# Patient Record
Sex: Female | Born: 1945 | Race: White | Hispanic: No | Marital: Married | Smoking: Never smoker
Health system: Southern US, Community
[De-identification: ages and names within clinical notes are randomized; demographics above are authoritative.]

## PROBLEM LIST (undated history)

## (undated) DIAGNOSIS — I10 Essential (primary) hypertension: Secondary | ICD-10-CM

## (undated) DIAGNOSIS — R32 Unspecified urinary incontinence: Secondary | ICD-10-CM

## (undated) HISTORY — DX: Essential (primary) hypertension: I10

## (undated) HISTORY — DX: Unspecified urinary incontinence: R32

## (undated) HISTORY — PX: CERVICAL DISC SURGERY: SHX588

## (undated) HISTORY — PX: DILATION AND CURETTAGE OF UTERUS: SHX78

## (undated) HISTORY — PX: OTHER SURGICAL HISTORY: SHX169

## (undated) HISTORY — PX: TUBAL LIGATION: SHX77

---

## 1997-07-29 ENCOUNTER — Ambulatory Visit (HOSPITAL_COMMUNITY): Admission: RE | Admit: 1997-07-29 | Discharge: 1997-07-29 | Payer: Self-pay | Admitting: Gastroenterology

## 1998-01-14 ENCOUNTER — Other Ambulatory Visit: Admission: RE | Admit: 1998-01-14 | Discharge: 1998-01-14 | Payer: Self-pay | Admitting: Gynecology

## 1999-01-02 ENCOUNTER — Other Ambulatory Visit: Admission: RE | Admit: 1999-01-02 | Discharge: 1999-01-02 | Payer: Self-pay | Admitting: Gynecology

## 1999-04-09 ENCOUNTER — Encounter: Payer: Self-pay | Admitting: Neurosurgery

## 1999-04-09 ENCOUNTER — Ambulatory Visit (HOSPITAL_COMMUNITY): Admission: RE | Admit: 1999-04-09 | Discharge: 1999-04-09 | Payer: Self-pay | Admitting: Neurosurgery

## 2001-03-22 ENCOUNTER — Encounter: Payer: Self-pay | Admitting: Obstetrics and Gynecology

## 2001-03-22 ENCOUNTER — Ambulatory Visit (HOSPITAL_COMMUNITY): Admission: RE | Admit: 2001-03-22 | Discharge: 2001-03-22 | Payer: Self-pay | Admitting: Obstetrics and Gynecology

## 2001-08-01 ENCOUNTER — Ambulatory Visit (HOSPITAL_COMMUNITY): Admission: RE | Admit: 2001-08-01 | Discharge: 2001-08-01 | Payer: Self-pay | Admitting: Obstetrics and Gynecology

## 2002-06-27 ENCOUNTER — Other Ambulatory Visit: Admission: RE | Admit: 2002-06-27 | Discharge: 2002-06-27 | Payer: Self-pay | Admitting: Obstetrics and Gynecology

## 2003-09-13 ENCOUNTER — Other Ambulatory Visit: Admission: RE | Admit: 2003-09-13 | Discharge: 2003-09-13 | Payer: Self-pay | Admitting: Obstetrics and Gynecology

## 2004-10-12 ENCOUNTER — Other Ambulatory Visit: Admission: RE | Admit: 2004-10-12 | Discharge: 2004-10-12 | Payer: Self-pay | Admitting: Obstetrics and Gynecology

## 2009-12-23 ENCOUNTER — Observation Stay (HOSPITAL_COMMUNITY)
Admission: EM | Admit: 2009-12-23 | Discharge: 2009-12-24 | Payer: Self-pay | Source: Home / Self Care | Admitting: Emergency Medicine

## 2010-03-06 ENCOUNTER — Other Ambulatory Visit: Payer: Self-pay | Admitting: Dermatology

## 2010-03-20 ENCOUNTER — Other Ambulatory Visit: Payer: Self-pay | Admitting: Obstetrics and Gynecology

## 2010-04-02 ENCOUNTER — Other Ambulatory Visit (HOSPITAL_COMMUNITY): Payer: Self-pay

## 2010-04-02 ENCOUNTER — Encounter (HOSPITAL_COMMUNITY)
Admission: RE | Admit: 2010-04-02 | Discharge: 2010-04-02 | Disposition: A | Discharge status: Home / Self Care | Payer: Managed Care, Other (non HMO) | Source: Ambulatory Visit | Attending: Obstetrics and Gynecology | Admitting: Obstetrics and Gynecology

## 2010-04-02 DIAGNOSIS — Z01812 Encounter for preprocedural laboratory examination: Secondary | ICD-10-CM | POA: Insufficient documentation

## 2010-04-02 HISTORY — PX: OTHER SURGICAL HISTORY: SHX169

## 2010-04-02 HISTORY — PX: ABDOMINAL HYSTERECTOMY: SHX81

## 2010-04-02 LAB — BASIC METABOLIC PANEL
BUN: 14 mg/dL (ref 6–23)
CO2: 27 mEq/L (ref 19–32)
Chloride: 105 mEq/L (ref 96–112)
Creatinine, Ser: 0.73 mg/dL (ref 0.4–1.2)
Potassium: 4.2 mEq/L (ref 3.5–5.1)

## 2010-04-02 LAB — CBC
HCT: 38.9 % (ref 36.0–46.0)
Hemoglobin: 12.7 g/dL (ref 12.0–15.0)
MCH: 28.4 pg (ref 26.0–34.0)
MCV: 87 fL (ref 78.0–100.0)
RBC: 4.47 MIL/uL (ref 3.87–5.11)
WBC: 4.2 10*3/uL (ref 4.0–10.5)

## 2010-04-07 ENCOUNTER — Inpatient Hospital Stay (HOSPITAL_COMMUNITY)
Admission: RE | Admit: 2010-04-07 | Discharge: 2010-04-10 | DRG: 742 | Disposition: A | Discharge status: Home / Self Care | Payer: Managed Care, Other (non HMO) | Source: Ambulatory Visit | Attending: Obstetrics and Gynecology | Admitting: Obstetrics and Gynecology

## 2010-04-07 ENCOUNTER — Other Ambulatory Visit: Payer: Self-pay | Admitting: Obstetrics and Gynecology

## 2010-04-07 DIAGNOSIS — L03319 Cellulitis of trunk, unspecified: Secondary | ICD-10-CM | POA: Diagnosis not present

## 2010-04-07 DIAGNOSIS — I959 Hypotension, unspecified: Secondary | ICD-10-CM | POA: Diagnosis not present

## 2010-04-07 DIAGNOSIS — IMO0002 Reserved for concepts with insufficient information to code with codable children: Secondary | ICD-10-CM | POA: Diagnosis not present

## 2010-04-07 DIAGNOSIS — N813 Complete uterovaginal prolapse: Principal | ICD-10-CM | POA: Diagnosis present

## 2010-04-07 DIAGNOSIS — T8140XA Infection following a procedure, unspecified, initial encounter: Secondary | ICD-10-CM | POA: Diagnosis not present

## 2010-04-07 DIAGNOSIS — L02219 Cutaneous abscess of trunk, unspecified: Secondary | ICD-10-CM | POA: Diagnosis not present

## 2010-04-07 DIAGNOSIS — N393 Stress incontinence (female) (male): Secondary | ICD-10-CM | POA: Diagnosis present

## 2010-04-07 DIAGNOSIS — N9489 Other specified conditions associated with female genital organs and menstrual cycle: Secondary | ICD-10-CM | POA: Diagnosis present

## 2010-04-07 DIAGNOSIS — N815 Vaginal enterocele: Secondary | ICD-10-CM | POA: Diagnosis present

## 2010-04-07 DIAGNOSIS — N736 Female pelvic peritoneal adhesions (postinfective): Secondary | ICD-10-CM | POA: Diagnosis present

## 2010-04-07 LAB — URINALYSIS, MICROSCOPIC ONLY
Bilirubin Urine: NEGATIVE
Hgb urine dipstick: NEGATIVE
Nitrite: NEGATIVE
Specific Gravity, Urine: 1.01 (ref 1.005–1.030)
Urobilinogen, UA: 0.2 mg/dL (ref 0.0–1.0)
pH: 7.5 (ref 5.0–8.0)

## 2010-04-07 LAB — TYPE AND SCREEN: ABO/RH(D): B POS

## 2010-04-08 LAB — CBC
MCH: 29 pg (ref 26.0–34.0)
MCHC: 33.1 g/dL (ref 30.0–36.0)
MCV: 87.5 fL (ref 78.0–100.0)
Platelets: 121 10*3/uL — ABNORMAL LOW (ref 150–400)
RDW: 12.6 % (ref 11.5–15.5)

## 2010-04-08 LAB — BASIC METABOLIC PANEL
BUN: 10 mg/dL (ref 6–23)
Calcium: 8.1 mg/dL — ABNORMAL LOW (ref 8.4–10.5)
Creatinine, Ser: 0.85 mg/dL (ref 0.4–1.2)
GFR calc Af Amer: >60 mL/min (ref >60)

## 2010-04-08 LAB — URINALYSIS, MICROSCOPIC ONLY
Glucose, UA: NEGATIVE mg/dL
Ketones, ur: 40 mg/dL — AB
Leukocytes, UA: NEGATIVE
Protein, ur: NEGATIVE mg/dL
Urobilinogen, UA: 0.2 mg/dL (ref 0.0–1.0)

## 2010-04-10 LAB — URINE CULTURE
Colony Count: NO GROWTH
Culture  Setup Time: 201203080446
Special Requests: NEGATIVE

## 2010-04-14 LAB — CARDIAC PANEL(CRET KIN+CKTOT+MB+TROPI)
Relative Index: INVALID (ref 0.0–2.5)
Relative Index: INVALID (ref 0.0–2.5)
Total CK: 77 U/L (ref 7–177)
Troponin I: 0.01 ng/mL (ref 0.00–0.06)

## 2010-04-14 LAB — LIPID PANEL
Cholesterol: 139 mg/dL (ref 0–200)
HDL: 61 mg/dL (ref >39)
Total CHOL/HDL Ratio: 2.3 RATIO
VLDL: 8 mg/dL (ref 0–40)

## 2010-04-14 LAB — POCT CARDIAC MARKERS
CKMB, poc: 1 ng/mL (ref 1.0–8.0)
Myoglobin, poc: 72.4 ng/mL (ref 12–200)
Troponin i, poc: <0.05 ng/mL (ref 0.00–0.09)

## 2010-04-14 LAB — COMPREHENSIVE METABOLIC PANEL
Albumin: 4.5 g/dL (ref 3.5–5.2)
BUN: 9 mg/dL (ref 6–23)
Calcium: 9.4 mg/dL (ref 8.4–10.5)
Glucose, Bld: 96 mg/dL (ref 70–99)
Total Protein: 7.2 g/dL (ref 6.0–8.3)

## 2010-04-14 LAB — CBC
HCT: 39.5 % (ref 36.0–46.0)
MCHC: 33.7 g/dL (ref 30.0–36.0)
MCV: 88 fL (ref 78.0–100.0)
Platelets: 159 10*3/uL (ref 150–400)
RDW: 12.2 % (ref 11.5–15.5)
WBC: 3.3 10*3/uL — ABNORMAL LOW (ref 4.0–10.5)

## 2010-04-14 LAB — PROTIME-INR
INR: 0.92 (ref 0.00–1.49)
Prothrombin Time: 12.6 seconds (ref 11.6–15.2)

## 2010-05-20 NOTE — Op Note (Signed)
NAMENICLOE, FRONTERA NO.:  [id number]  MEDICAL RECORD NO.:  [id number]           PATIENT TYPE:  I  LOCATION:  9304                          FACILITY:  [facility]  PHYSICIAN:  Randye Lobo, M.D.   DATE OF BIRTH:  11/22/1945  DATE OF PROCEDURE:  04/07/2010 DATE OF DISCHARGE:                              OPERATIVE REPORT   PREOPERATIVE DIAGNOSES: 1. Complete uterovaginal prolapse. 2. Genuine stress incontinence.  POSTOPERATIVE DIAGNOSES: 1. Complete uterovaginal prolapse. 2. Genuine stress incontinence. 3. Intra-abdominal adhesions.  PROCEDURE:  Total abdominal hysterectomy with bilateral salpingo- oophorectomy, extensive lysis of adhesions, McCall culdoplasty, abdominal sacrocolpopexy, tension-free vaginal tape mid urethral sling with cystoscopy, anterior and posterior colporrhaphy, incisional revision.  SURGEON:  Conley Simmonds, MD  ASSISTANT:  Lodema Hong, MD and Gretchen Short, [facility]  ANESTHESIA:  General endotracheal, local with 1% lidocaine with epinephrine 1:100,000.  IV FLUIDS:  Ringer's lactate 2500 mL.  ESTIMATED BLOOD LOSS:  250 mL.  URINE OUTPUT:  300 mL.  COMPLICATIONS:  None.  INDICATIONS FOR PROCEDURE:  The patient is a 65 year old female who presents with longstanding uterovaginal prolapse which has become progressive and with urinary incontinence with forceful maneuvers, who now requests to proceed with surgical treatment.  A pessary has become unsatisfactory to her.  Multichannel urodynamic testing in the office confirmed the presence of genuine stress incontinence.  The patient has had 2 prior laparotomies, and she is also requesting incisional revision.  A plan is now made to proceed with a total abdominal hysterectomy with bilateral salpingo-oophorectomy, McCall culdoplasty, abdominal sacrocolpopexy, tension-free vaginal tape mid urethral sling with cystoscopy, anterior and posterior colporrhaphy, and incisional revision.  Risks,  benefits, and alternatives have been discussed with the patient who now wishes to proceed.  FINDINGS:  Laparotomy performed by Pfannenstiel incision demonstrated a significant adhesions of the omentum in large and small intestine to the anterior abdominal wall and the right pelvic side wall and tube and ovary.  The uterus itself was unremarkable.  There was a fair amount of scarring located near the right fallopian tube.  Both of the ovaries were atrophic.  At the end of the procedure, it is possible that some ovarian tissue remained on the large intestine and due to the significant nature of the adhesions.  Cystoscopy with placement of the sling documented the absence of a foreign body in the bladder and urethra.  The bladder was visualized throughout 360 degrees including the bladder dome and trigone.  The ureters were noted to be patent bilaterally.  SPECIMEN:  Uterus, tubes and ovaries were sent to Pathology.  PROCEDURE:  The patient was re-identified in the preoperative hold area. She received cefotetan IV for antibiotic prophylaxis, and she received both TED hose and PAS stockings for DVT prophylaxis.  The patient was escorted to the operating room and placed in the supine position on the operating table.  General endotracheal anesthesia was induced.  She was placed in the dorsal lithotomy position.  The abdomen, vagina, and perineum were sterilely prepped and draped.  A Foley catheter was placed inside the bladder.  The procedure began with sharp  excision of the patient's prior Pfannenstiel incisions.  This was performed sharply with a scalpel. Cautery was used to create hemostasis in the subcutaneous layer.  There was significant scar tissue of the anterior abdominal wall itself.  The fascia was identified and was incised in the midline.  The fascial incision was extended bilaterally with the Mayo scissors.  Sharp dissection was used to dissect the fascia off of the  rectus muscles superiorly and inferiorly.  The peritoneum was elevated with 2 hemostat clamps and entered sharply.  The peritoneal incision was extended slightly.  It became obvious that there were thick omental adhesions to the anterior abdominal wall at this point.  Careful dissection allowed the omentum to become released from the anterior abdominal wall.  The dissection was performed with sharp dissection and with monopolar cautery.  The adhesions extended all the way up to the umbilicus and down to the level of the bladder dome.  Once the omentum was free, lysis of adhesions of the large and small intestine from the right anterior abdominal wall in the right pelvic sidewall began.  Eventually, it was possible to place a self-retaining retractor.  As the bowel was dissected off of the right ovary, the retroperitoneal space was entered. The ureter on this side was identified.  It was noted to peristalsis freely.  At this time, there was adequate exposure for placement of lap pads to pack the bowel into the upper abdomen.  The abdominal hysterectomy was performed at this time.  Kelly clamps were placed across the adnexal structures bilaterally.  Each of the round ligament was grasped and suture ligated with transfixing sutures of 0 Vicryl.  The round ligaments were each divided with monopolar cautery.  The anterior and posterior leaves of the broad ligaments were then opened using monopolar cautery.  The bladder flap was created using monopolar cautery as well.  The left infundibulopelvic ligament was identified.  A window was created through the posterior leaf of the broad ligament and the infundibulopelvic ligament was doubly clamped, sharply divided, and then was ligated with a free tie of 0 Vicryl followed by suture ligature of the same.  The same procedure was performed on the patient's right-hand side.  The ureters were identified carefully prior to dissection of the  infundibulopelvic ligaments.  The uterine arteries were skeletonized at this time.  The arteries were doubly clamped, sharply divided, and suture ligated with 0 Vicryl. Dissection then continued through the inferior aspects of the cardinal ligaments which were clamped, sharply divided, and suture ligated with 0 Vicryl.  Curved Heaney clamps were then placed across the uterosacral ligaments, bilaterally.  The pedicles were sharply divided.  At this time, the cervix was circumscribed from the vaginal apex using the Mayo scissors.  Angle sutures of 0 Vicryl were placed bilaterally.  The remaining vaginal cuff was closed with a figure-of-eight suture of 0 Vicryl.  At this time, the hemostasis was noted to be good.  The remaining aspect of the right pelvic sidewall peritoneum was then opened with monopolar cautery.  This was done so that the mesh for the sacral colpopexy could be eventually retroperitoneal lysed.  The area overlying the sacrum was addressed next where the peritoneum was carefully opened after the right ureter and the left internal iliac vessels were identified.  The anterior sacrum was exposed.  The McCall culdoplasty was performed at this time using two sutures of two over Ethibond.  One suture was placed to the right of the rectosigmoid colon,  and one was placed to the left of the rectosigmoid colon.  The bladder was dissected off of the anterior vagina using blunt dissection and monopolar cautery.  The peritoneum was dissected off of the posterior vagina again using cautery and blunt dissection.  The vaginal obturator was then placed.  This allowed for clear identification of the vagina.  The sacrocolpopexy sutures were then placed.  Two rows of 0 Ethibond suture were placed along the posterior vagina and 2 rows of sutures were placed along the anterior vagina.  Alyte mesh was placed directly with placement of the sutures.  A suture was placed in the midline at the  very ends of the mesh.  Excess mesh was then trimmed anteriorly and posteriorly.  A row of two sutures were then placed in the sacrum overlying the anterior longitudinal ligament. Again these were 0 Ethibond sutures.  The sutures were then woven through the mesh.  The vaginal elevation was checked manually with the left hand.  The positioning of the sacrocolpopexy mesh was confirmed to be good.  Excess mesh was then trimmed at the level of the sacrum.  The mesh was reperitonealized and completely covered from the level of the sacrum down to the vagina.  The abdomen was closed at this time.  The parietal peritoneum was closed with a running suture of 2-0 Vicryl.  There were places were there was no remaining peritoneum due to the adhesions and the rectus muscle therefore needed to be reapproximated with the running suture of 2-0 Vicryl that was intended to close the peritoneum only.  The fascia was then closed with a running suture of 0 Vicryl bilaterally.  The subcutaneous layer was undermined at this time by using monopolar cautery.  Interrupted sutures of 2-0 plain gut suture were placed in the subcutaneous layer.  The skin was closed with staples.  The bandage was placed over the incision.  At this time attention was turned to the vaginal portion of the procedure.  The patient was placed in the high lithotomy position.  A weighted speculum was placed inside the vagina.  Allis clamps were used to mark the anterior vaginal wall from 1 cm below the urethral meatus to 2 cm from the vaginal apex.  The mucosa was injected locally with 1% lidocaine with epinephrine 1:100,000.  The vaginal mucosa was incised vertically in the midline.  A combination of sharp and blunt dissection were used to dissect the bladder and subvaginal tissue off of the overlying vaginal mucosa.  The dissection was carried back to the level of the pubic rami and back to 2 cm below the level of the vaginal cuff.  The  sling was performed next.  1 cm suprapubic incisions were then created 2 cm to the right and left in the midline.  The sling was performed in a top-down fashion.  The abdominal needle passer was placed to the right suprapubic incision and then out through the endopelvic tissue in the vagina at the level of the mid urethra and lateral to it on the ipsilateral side.  The same was then performed on the patient's left-hand side.  The Foley catheter was removed and cystoscopy was performed and the findings were as noted above.  The bladder was then drained of cystoscopic fluid and the Foley catheter was replaced.  The sling was then attached to the abdominal needle passers and drawn up through the suprapubic incisions bilaterally.  A Kelly clamp was placed between the sling in the urethra as  the plastic sheaths were removed.  Excess sling was trimmed.  The sling was noted to be in good position.  An anterior colporrhaphy was performed at this time with vertical mattress sutures of 2-0 Vicryl.  Excess vaginal mucosa was trimmed and the anterior vaginal wall was closed with running locked suture of 2-0 Vicryl.  The rectal exam was performed at this time, and there was a rectocele appreciated.  The posterior introitus was marked with Allis clamps bilaterally.  The rectocele was marked with Allis clamps in the midline. The perineal body in the posterior vaginal mucosa were then injected locally with 1% lidocaine with 1:100,000 of epinephrine.  A triangular wedge of epithelium was excised from the perineal body.  The posterior vaginal mucosa was then incised vertically in the midline with a Metzenbaum scissors.  Sharp and blunt dissection were used to dissect the perirectal fascia off of the vaginal mucosa bilaterally.  Hemostasis was created with monopolar cautery.  The colporrhaphy was performed with a combination of vertical mattress sutures of both 2-0 and 0 Vicryl.  A crown stitch of 0  Vicryl was placed on the perineal body.  After excess vaginal mucosa was trimmed, the posterior vaginal wall was closed with a running locked suture of 2-0 Vicryl which was continued along the perineal body in a subcuticular fashion this for an episiotomy repair.  Hemostasis was good at this time.  The vaginal packing with Estrace cream was placed inside the vagina.  Final rectal exam confirmed the absence of sutures in the rectum.  The suprapubic incisions were closed with Dermabond solution.  A pressure dressing was used to replace a temporary dressing over the Pfannenstiel incision.  This concluded the patient's procedure.  There were no complications. All needle, instrument, and sponge counts were correct.     Randye Lobo, M.D.     BES/MEDQ  D:  04/07/2010  T:  04/08/2010  Job:  [id number]  Electronically Signed by Conley Simmonds M.D. on 05/20/2010 01:46:01 PM

## 2010-05-21 NOTE — Discharge Summary (Signed)
Erin Herring, Erin Herring NO.:  [id number]  MEDICAL RECORD NO.:  [id number]           PATIENT TYPE:  I  LOCATION:  9304                          FACILITY:  [facility]  PHYSICIAN:  Randye Lobo, M.D.   DATE OF BIRTH:  November 17, 1945  DATE OF ADMISSION:  04/07/2010 DATE OF DISCHARGE:  04/10/2010                              DISCHARGE SUMMARY   ADMISSION DIAGNOSES: 1. Complete uterovaginal prolapse. 2. Genuine stress incontinence.  DISCHARGE DIAGNOSES: 1. Complete uterovaginal prolapse. 2. Genuine stress incontinence. 3. Intra-abdominal adhesions. 4. Status post total abdominal hysterectomy with bilateral salpingo-     oophorectomy, extensive lysis of adhesions, McCall culdoplasty,     abdominal sacrocolpopexy, tension-free vaginal tape mid urethral     sling with cystoscopy, anterior and posterior colporrhaphy, and     incisional revision. 5. Postoperative wound hematoma and cellulitis.  ADMISSION HISTORY AND PHYSICAL EXAMINATION:  The patient is a 65 year old gravida 4, para 3-0-1-2 Caucasian female who presented with longstanding uterovaginal prolapse and incontinence of urine with stressful maneuvers.  The patient was using a pessary which was unsatisfactory to her for treatment of her prolapse.  She wished for surgical treatment of both her prolapse and urinary incontinence.  She went through multichannel urodynamic testing in the office which documented the presence of genuine stress incontinence with a leak point pressure 52 cm of water.  The patient had a history of two prior laparotomies, one for a tubal ligation reversal and one for treatment of a ruptured ovarian cyst.  The patient had retraction along her Pfannenstiel incision that she wished for revision at the time of her upcoming surgery.  On physical examination, the patient was noted to have a Pfannenstiel incision with retraction of the area along the majority of the incisional length.  On pelvic  examination, she was noted to have complete uterovaginal prolapse.  The uterus was small and nontender and there was no evidence of any adnexal masses nor tenderness.  HOSPITAL COURSE:  The patient was admitted on April 07, 2010, at which time she was taken to the operating room for total abdominal hysterectomy with bilateral salpingo-oophorectomy, McCall culdoplasty, abdominal sacrocolpopexy, extensive lysis of adhesions, anterior and posterior colporrhaphy, tension-free vaginal tape mid urethral sling with cystoscopy and an incisional revision which was performed under the direction of Dr. Conley Simmonds with the assistance of Dr. Lodema Hong, and Gretchen Short, PA-C.  The patient's surgical procedure was unremarkable and she had an estimated blood loss of 250 mL.  Postoperatively, the patient was maintained on a morphine PCA and Toradol for pain control.  By postoperative day #1, the patient was ambulating well.  Her PCA was discontinued and she began taking oral pain medication.  Her postop day #1 hemoglobin was 9.3.  Later that day, the patient developed a postoperative temperature to 100.7.  Her examination was significant for incisional ecchymoses.  A urinalysis demonstrated no sign of infection.  Her incision was monitored closely at this time.  Her diet was advanced to normal.  The patient began her voiding trials and when she had manual pressure to the skin  near the Pfannenstiel incision to do ultrasound monitoring for a postvoid residual, the patient was noted to have bloody incisional drainage.  I examined the patient at this time and diagnosed her with an incisional hematoma and developing cellulitis.  I therefore obtained consent for opening a portion of the incision along with cleansing and packing.  The subcutaneous and the deeper layers appeared to be intact by palpation with a sterile Q-tip.  The incision was packed with iodoform gauze.  The  patient was started on  Augmentin 500 mg one p.o. t.i.d.  By postoperative day #3, the incisional appearance was improved.  The subcutaneous tissue appeared to be healthy.  The incision was again cleansed and repacked with iodoform gauze and then covered.  There was much less edema of the incision.  The patient was afebrile and stable at this time and a decision was made to proceed with discharge.  The patient was voiding spontaneously and was low residual volumes.  INSTRUCTIONS AT DISCHARGE: 1. Discharged to home. 2. The patient will take the following medications, Percocet 5 mg/325     mg 1-2 p.o. q.4-6 hours p.r.n. pain, ibuprofen 600 mg p.o. q.6     hours p.r.n. pain, Augmentin 500 mg p.o. t.i.d. x7 days. 3. The patient will follow up in the office on April 11, 2010, for a     dressing change. 4. The patient will follow a regular diet. 5. The patient will have decresed activity.  She will not drive for 2 - 3    weeks.  She will not do any strenuous activity for 12 weeks or have     sexual activity for 12 weeks. 6. The patient will call if she experiences problems with fever,     nausea, and vomiting, pain uncontrolled by her medication, heavy     vaginal bleeding, heavy incisional bleeding or drainage, difficulty      voiding, or any other concern.      Randye Lobo, M.D.     BES/MEDQ  D:  05/20/2010  T:  05/21/2010  Job:  [id number]  Electronically Signed by Conley Simmonds M.D. on 05/21/2010 06:37:50 AM

## 2011-12-31 IMAGING — CR DG CHEST 1V PORT
1 series · 1 of 1 positions shown · non-contrast
Comparison: None.

CLINICAL DATA: Chest pain.

PORTABLE CHEST - 1 VIEW

[view not recorded]
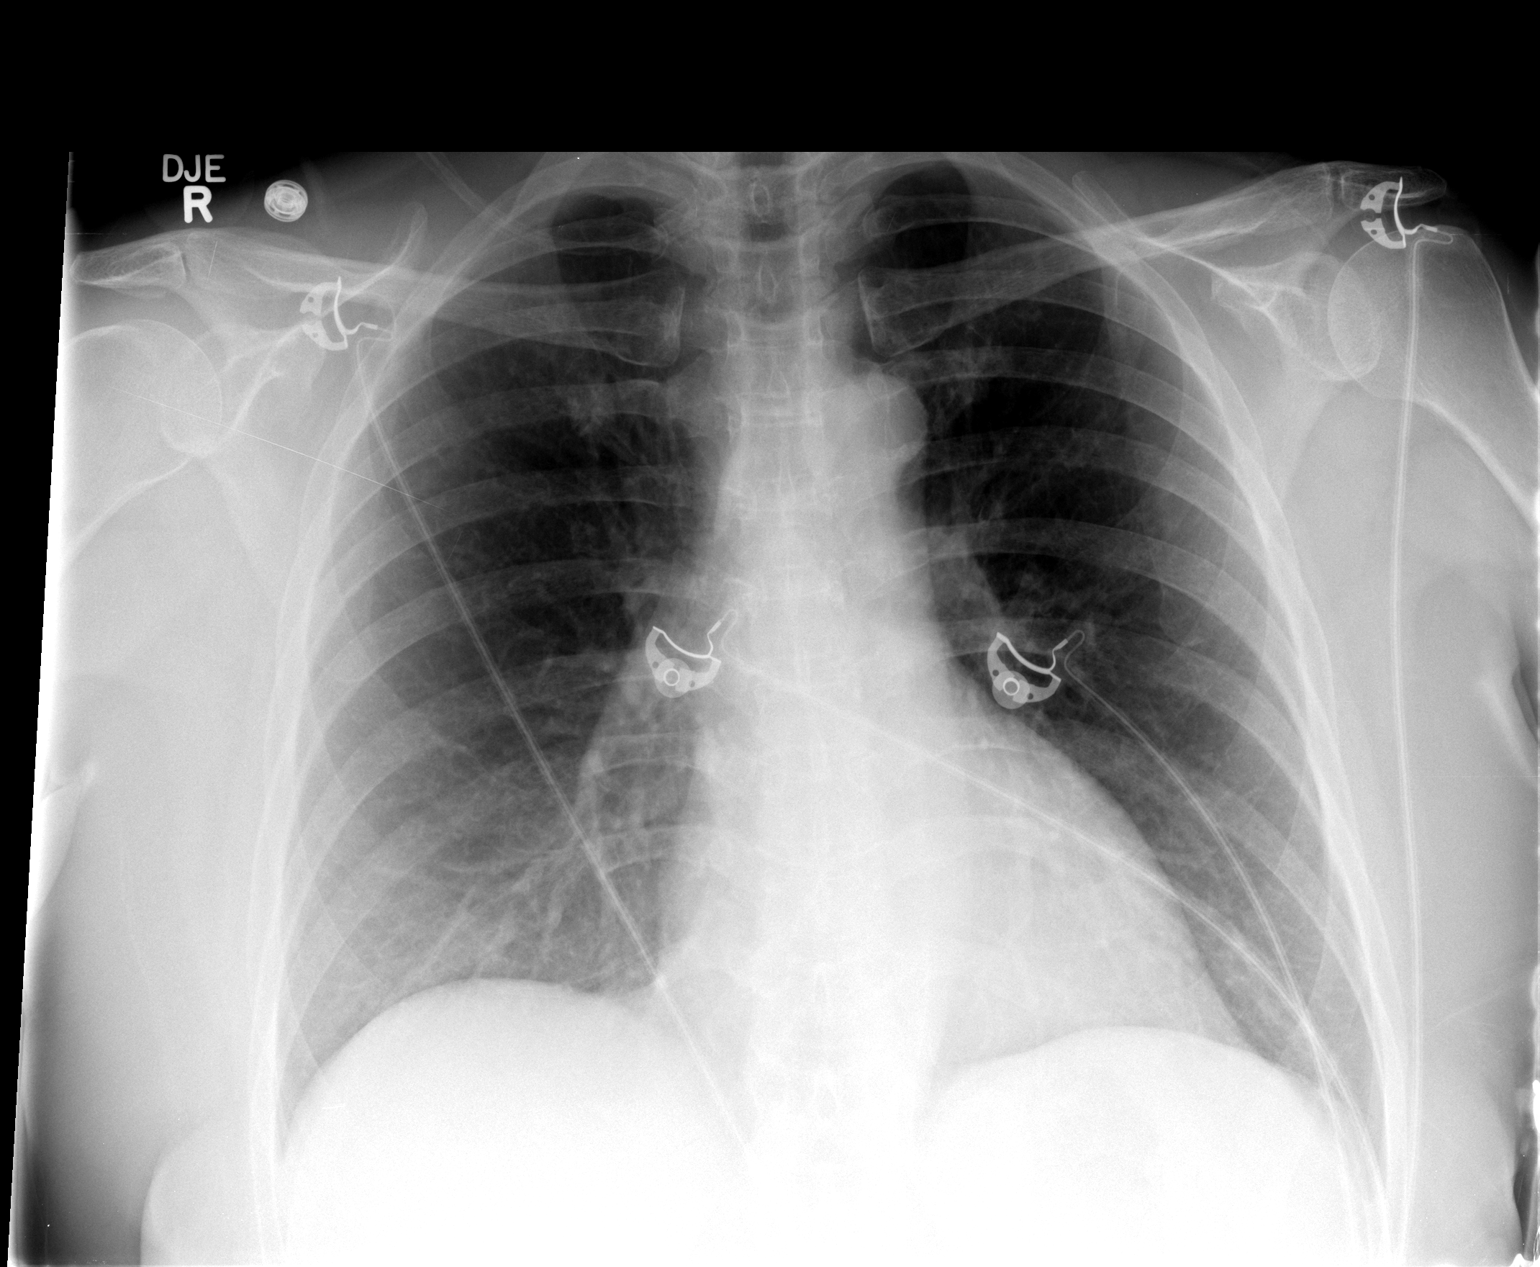

[1 of 1 positions shown; findings below may reference images not displayed]

FINDINGS: Lungs are clear.  Heart size is normal.  No effusion or
focal bony abnormality.
IMPRESSION: No acute disease.

## 2012-01-01 IMAGING — US US ABDOMEN COMPLETE
1 series · 13 of 25 positions shown · non-contrast
Comparison: None

CLINICAL DATA: Abdominal pain.

ABDOMINAL ULTRASOUND COMPLETE

[Series 1: us abdomen complete · 0.25mm/px · 13 of 73 slices shown]
[im 1/73]
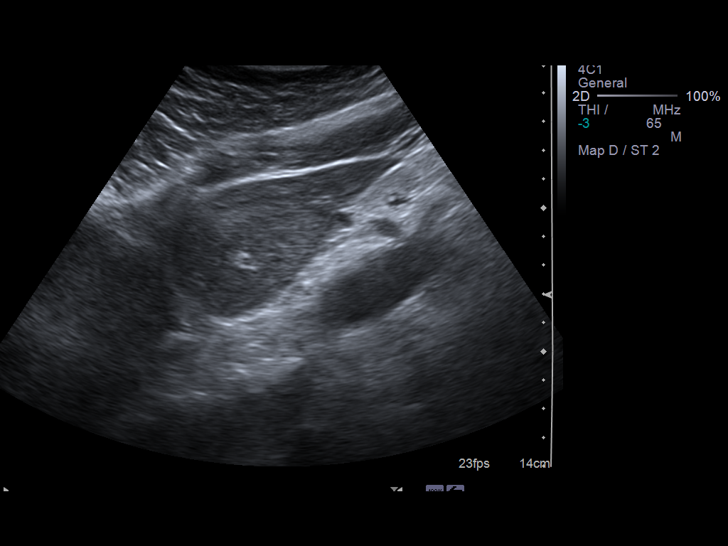
[im 7/73]
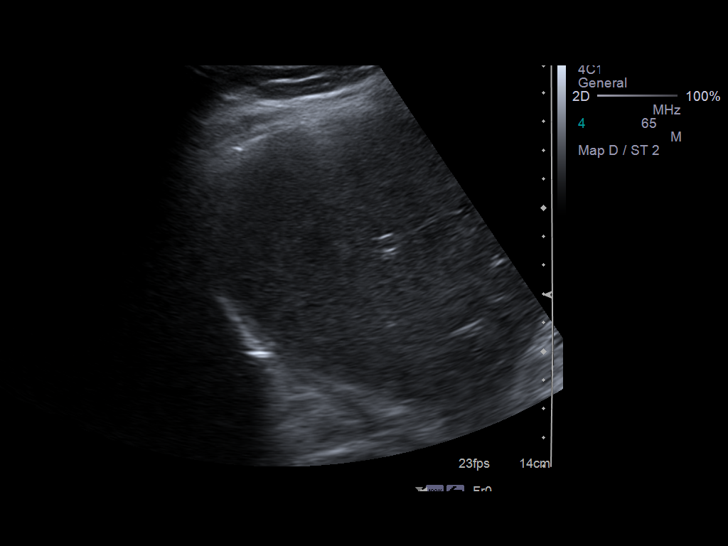
[im 13/73]
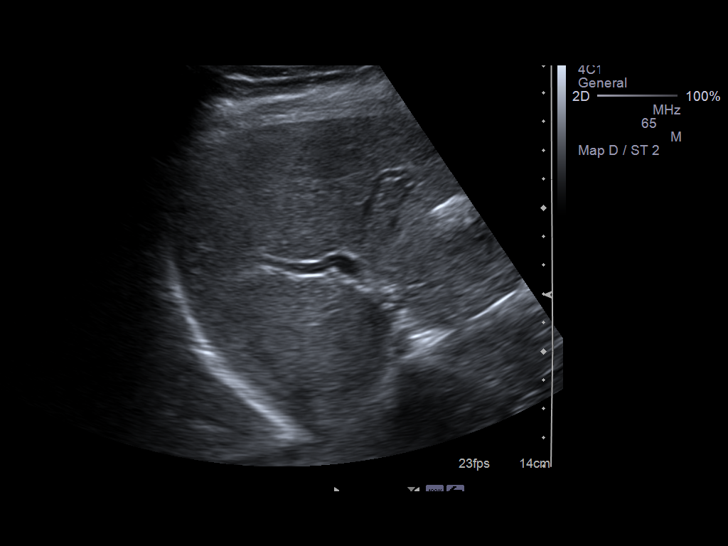
[im 19/73]
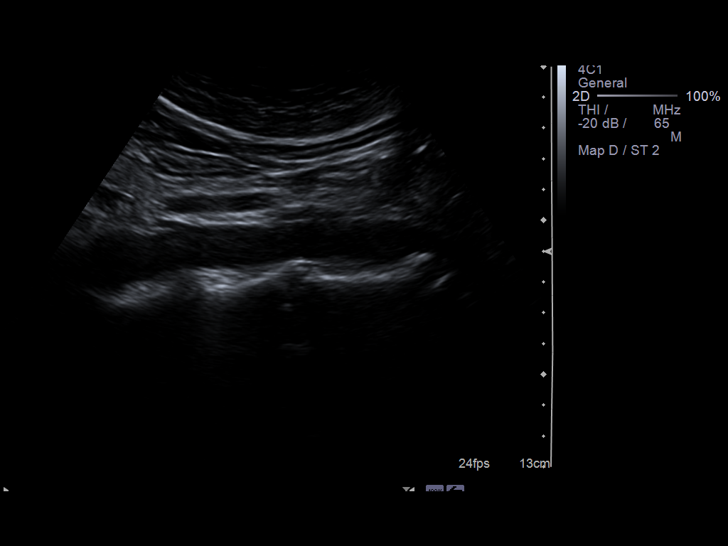
[im 25/73]
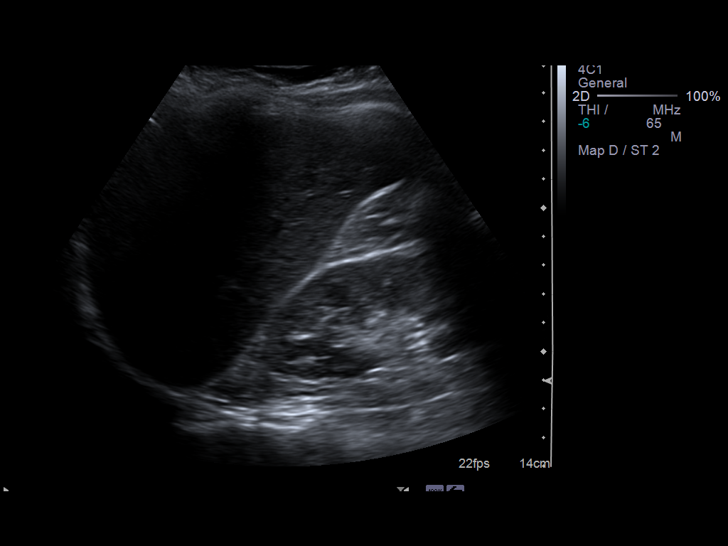
[im 31/73]
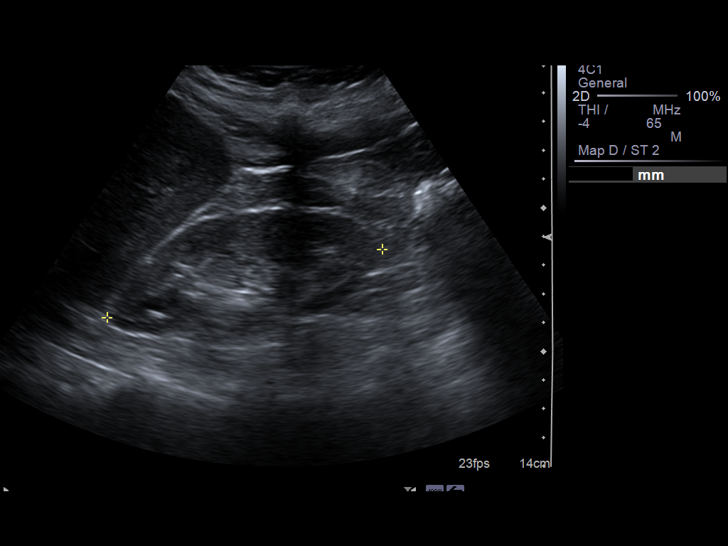
[im 37/73]
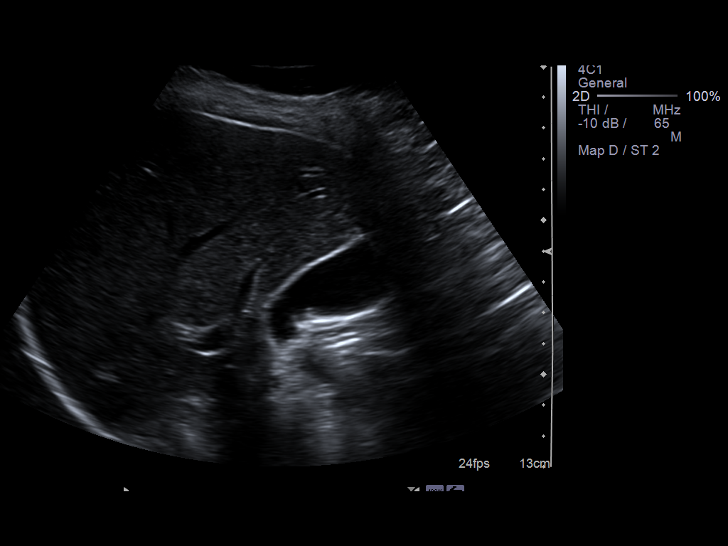
[im 43/73]
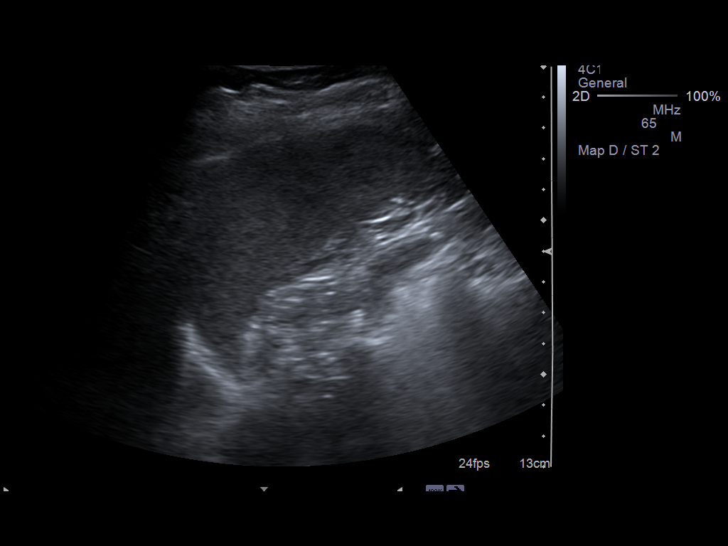
[im 49/73]
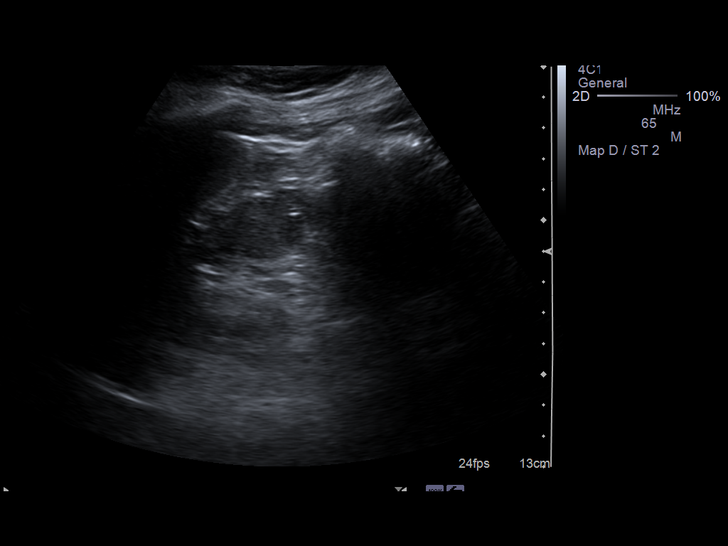
[im 55/73]
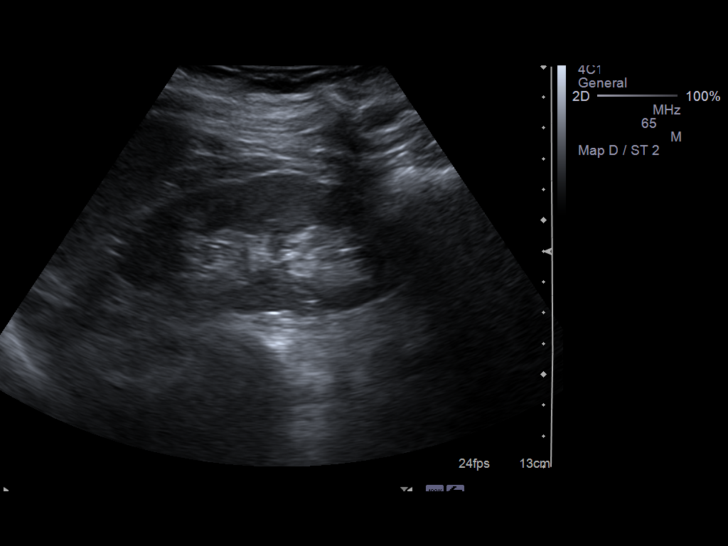
[im 61/73]
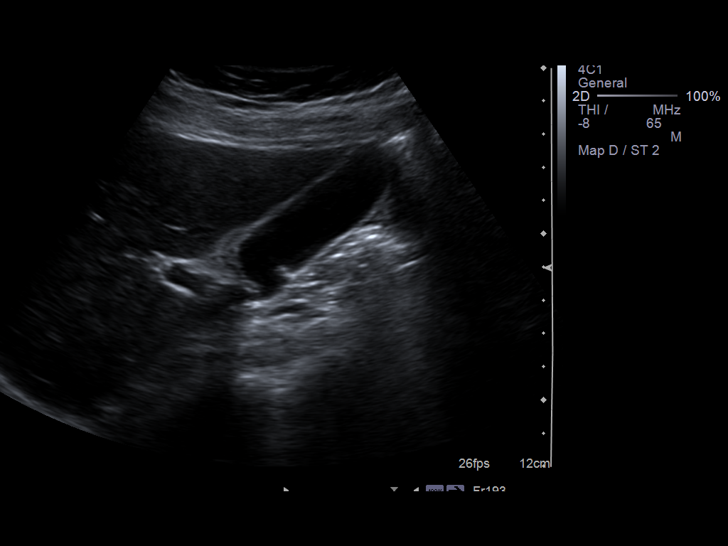
[im 67/73]
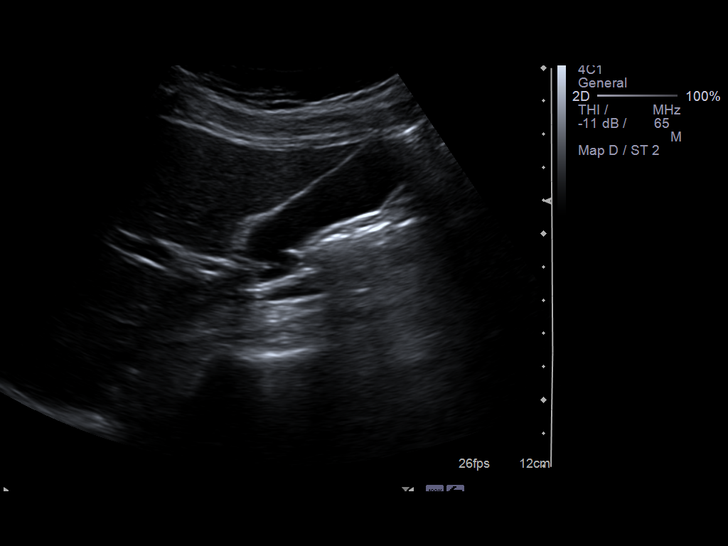
[im 73/73]
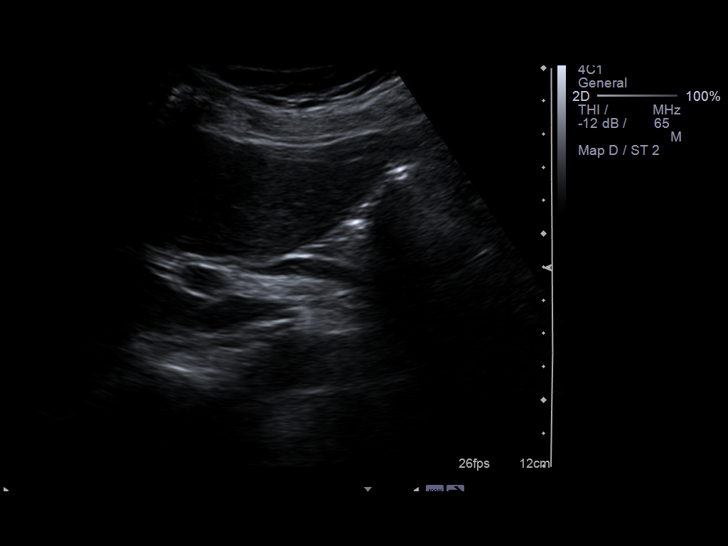

[13 of 25 positions shown; findings below may reference images not displayed]

FINDINGS: Gallbladder:  The gallbladder is unremarkable. There is no evidence
of gallstones, gallbladder wall thickening, or pericholecystic
fluid.

Common Bile Duct:  There is no evidence of intrahepatic or
extrahepatic biliary dilation. The CBD measures 5.6 mm in greatest
diameter.

Liver:  The liver is within normal limits in parenchymal
echogenicity. No focal abnormalities are identified.

IVC:  Appears normal.

Pancreas:  Although the pancreas is difficult to visualize in its
entirety, no focal pancreatic abnormality is identified.

Spleen:  Within normal limits in size and echotexture.

Right kidney:  The right kidney is normal in size and parenchymal
echogenicity.  There is no evidence of solid mass, hydronephrosis
or definite renal calculi.  The right kidney measures 9.9 cm.

Left kidney:  The left kidney is normal in size and parenchymal
echogenicity.  There is no evidence of solid mass, hydronephrosis
or definite renal calculi.   The left kidney measures 10.3 cm.

Abdominal Aorta:  No abdominal aortic aneurysm identified.

There is no evidence of ascites.
IMPRESSION: Negative abdominal ultrasound.

## 2012-08-01 HISTORY — PX: BREAST SURGERY: SHX581

## 2012-08-01 HISTORY — PX: OTHER SURGICAL HISTORY: SHX169

## 2012-08-22 ENCOUNTER — Other Ambulatory Visit: Payer: Self-pay | Admitting: Plastic Surgery

## 2012-10-02 HISTORY — PX: COSMETIC SURGERY: SHX468

## 2012-10-25 ENCOUNTER — Ambulatory Visit (INDEPENDENT_AMBULATORY_CARE_PROVIDER_SITE_OTHER): Payer: Managed Care, Other (non HMO) | Admitting: Obstetrics and Gynecology

## 2012-10-25 ENCOUNTER — Encounter: Payer: Self-pay | Admitting: Obstetrics and Gynecology

## 2012-10-25 VITALS — BP 120/60 | HR 64 | Ht 61.5 in | Wt 122.0 lb

## 2012-10-25 DIAGNOSIS — Z0000 Encounter for general adult medical examination without abnormal findings: Secondary | ICD-10-CM

## 2012-10-25 DIAGNOSIS — N3281 Overactive bladder: Secondary | ICD-10-CM

## 2012-10-25 DIAGNOSIS — Z13820 Encounter for screening for osteoporosis: Secondary | ICD-10-CM

## 2012-10-25 DIAGNOSIS — Z01419 Encounter for gynecological examination (general) (routine) without abnormal findings: Secondary | ICD-10-CM

## 2012-10-25 DIAGNOSIS — N318 Other neuromuscular dysfunction of bladder: Secondary | ICD-10-CM

## 2012-10-25 LAB — POCT URINALYSIS DIPSTICK
Glucose, UA: NEGATIVE
Ketones, UA: NEGATIVE
Leukocytes, UA: NEGATIVE
Protein, UA: NEGATIVE

## 2012-10-25 MED ORDER — TROSPIUM CHLORIDE ER 60 MG PO CP24: 60.0000 mg | ORAL_CAPSULE | Freq: Every day | ORAL | Status: DC

## 2012-10-25 NOTE — Patient Instructions (Addendum)
EXERCISE AND DIET:  We recommended that you start or continue a regular exercise program for good health. Regular exercise means any activity that makes your heart beat faster and makes you sweat.  We recommend exercising at least 30 minutes per day at least 3 days a week, preferably 4 or 5.  We also recommend a diet low in fat and sugar.  Inactivity, poor dietary choices and obesity can cause diabetes, heart attack, stroke, and kidney damage, among others.    ALCOHOL AND SMOKING:  Women should limit their alcohol intake to no more than 7 drinks/beers/glasses of wine (combined, not each!) per week. Moderation of alcohol intake to this level decreases your risk of breast cancer and liver damage. And of course, no recreational drugs are part of a healthy lifestyle.  And absolutely no smoking or even second hand smoke. Most people know smoking can cause heart and lung diseases, but did you know it also contributes to weakening of your bones? Aging of your skin?  Yellowing of your teeth and nails?  CALCIUM AND VITAMIN D:  Adequate intake of calcium and Vitamin D are recommended.  The recommendations for exact amounts of these supplements seem to change often, but generally speaking 600 mg of calcium (either carbonate or citrate) and 800 units of Vitamin D per day seems prudent. Certain women may benefit from higher intake of Vitamin D.  If you are among these women, your doctor will have told you during your visit.    PAP SMEARS:  Pap smears, to check for cervical cancer or precancers,  have traditionally been done yearly, although recent scientific advances have shown that most women can have pap smears less often.  However, every woman still should have a physical exam from her gynecologist every year. It will include a breast check, inspection of the vulva and vagina to check for abnormal growths or skin changes, a visual exam of the cervix, and then an exam to evaluate the size and shape of the uterus and  ovaries.  And after 65 years of age, a rectal exam is indicated to check for rectal cancers. We will also provide age appropriate advice regarding health maintenance, like when you should have certain vaccines, screening for sexually transmitted diseases, bone density testing, colonoscopy, mammograms, etc.   MAMMOGRAMS:  All women over 40 years old should have a yearly mammogram. Many facilities now offer a "3D" mammogram, which may cost around $50 extra out of pocket. If possible,  we recommend you accept the option to have the 3D mammogram performed.  It both reduces the number of women who will be called back for extra views which then turn out to be normal, and it is better than the routine mammogram at detecting truly abnormal areas.    COLONOSCOPY:  Colonoscopy to screen for colon cancer is recommended for all women at age 50.  We know, you hate the idea of the prep.  We agree, BUT, having colon cancer and not knowing it is worse!!  Colon cancer so often starts as a polyp that can be seen and removed at colonscopy, which can quite literally save your life!  And if your first colonoscopy is normal and you have no family history of colon cancer, most women don't have to have it again for 10 years.  Once every ten years, you can do something that may end up saving your life, right?  We will be happy to help you get it scheduled when you are ready.    Be sure to check your insurance coverage so you understand how much it will cost.  It may be covered as a preventative service at no cost, but you should check your particular policy.     Trospium extended-release capsules What is this medicine? TROSPIUM (TROSE pee um) is used to treat overactive bladder. This medicine reduces the amount of bathroom visits. It may also help to control wetting accidents. This medicine may be used for other purposes; ask your health care provider or pharmacist if you have questions. What should I tell my health care provider  before I take this medicine? They need to know if you have any of these conditions: -difficulty passing urine -glaucoma -intestinal obstruction or constipation -kidney disease -liver disease -an unusual or allergic reaction to trospium, other medicines, foods, dyes, or preservatives -pregnant or trying to get pregnant -breast-feeding How should I use this medicine? Take this medicine by mouth with a glass of water. Follow the directions on the prescription label. Take this medicine on an empty stomach, at least 1 hour before food. Take your doses at regular intervals. Do not take your medicine more often than directed. Talk to your pediatrician regarding the use of this medicine in children. Special care may be needed. Overdosage: If you think you have taken too much of this medicine contact a poison control center or emergency room at once. NOTE: This medicine is only for you. Do not share this medicine with others. What if I miss a dose? If you miss a dose, take it as soon as you can. If it is almost time for your next dose, take only that dose. Do not take double or extra doses. What may interact with this medicine? Do not take this medicine with any of the following medications: -dofetilide This medicine may also interact with the following medications: -digoxin -metformin -morphine -pancuronium -procainamide -some medicines for colds, hay fever, or allergies -tenofovir -vancomycin This list may not describe all possible interactions. Give your health care provider a list of all the medicines, herbs, non-prescription drugs, or dietary supplements you use. Also tell them if you smoke, drink alcohol, or use illegal drugs. Some items may interact with your medicine. What should I watch for while using this medicine? You may need to limit your intake tea, coffee, caffeinated sodas, and alcohol. These drinks may make your symptoms worse. You may get drowsy or dizzy. Do not drive, use  machinery, or do anything that needs mental alertness until you know how this medicine affects you. Do not stand or sit up quickly, especially if you are an older patient. This reduces the risk of dizzy or fainting spells. Alcohol may interfere with the effect of this medicine. Avoid alcoholic drinks. Your mouth may get dry. Chewing sugarless gum or sucking hard candy, and drinking plenty of water may help. Contact your doctor if the problem does not go away or is severe. This medicine may cause dry eyes and blurred vision. If you wear contact lenses you may feel some discomfort. Lubricating drops may help. See your eye doctor if the problem does not go away or is severe. Avoid extreme heat. This medicine can cause you to sweat less than normal. Your body temperature could increase to dangerous levels, which may lead to heat stroke. What side effects may I notice from receiving this medicine? Side effects that you should report to your doctor or health care professional as soon as possible: -allergic reactions like skin rash, itching or hives, swelling  of the face, lips, or tongue -blurred vision or difficulty focusing vision -breathing problems -confusion -difficulty passing urine -hallucinations -severe dizziness Side effects that usually do not require medical attention (report to your doctor or health care professional if they continue or are bothersome): -constipation -drowsiness -headache -indigestion or stomach upset -nausea This list may not describe all possible side effects. Call your doctor for medical advice about side effects. You may report side effects to FDA at 1-800-FDA-1088. Where should I keep my medicine? Keep out of the reach of children. Store at room temperature between 20 and 25 degrees C (68 and 77 degrees F). Throw away any unused medicine after the expiration date. NOTE: This sheet is a summary. It may not cover all possible information. If you have questions about  this medicine, talk to your doctor, pharmacist, or health care provider.  2013, [employer]. (09/02/2010 2:04:25 PM)

## 2012-10-25 NOTE — Progress Notes (Signed)
Patient ID: Erin Herring, female   DOB: 04/12/1945, 67 y.o.   MRN: [id number] GYNECOLOGY VISIT  PCP:  Dr. Elias Else  Referring provider:   HPI: 67 y.o.   Married  Caucasian  female   [phone] with No LMP recorded. Patient has had a hysterectomy.   here for   AEX. Had abdominoplasty, breast lift , and blepheroplasty done 2 months ago.  Has urgency to void.  Happens when water is running.  Cannot stop the stream once she starts it.   No leak with cough or laugh.    Has a caruncle.  Saw Dr. Patsi Sears.  This was noted at time of the foley catheter placement for recent surgery. Dr. Patsi Sears did a cystoscopy which was normal. He suggested vaginal estrogen cream.    Hgb:   Urine:  Neg  GYNECOLOGIC HISTORY: No LMP recorded. Patient has had a hysterectomy.    Sexually active: no  Partner preference: female Contraception: hysterectomy   Menopausal hormone therapy: no DES exposure: no   Blood transfusions:   no Sexually transmitted diseases:   no GYN Procedures: TAH/BSO/extensive lysis of adhesions, Mccall culdoplasty, abdominal sacrocolpopexy, TVt sling with cystoscopy, anterior and posterior colporrhaphy, incisional revision - 04/07/10 Mammogram: 11/2011 wnl:[facility]                Pap: 07/2011 wnl   History of abnormal pap smear:  no   OB History   Grav Para Term Preterm Abortions TAB SAB Ect Mult Living   5 3 3  2  2   3        LIFESTYLE: Exercise:     Water aerobics          Tobacco:      no Alcohol:         1-2 drinks per week Drug use:       no  OTHER HEALTH MAINTENANCE: Tetanus/TDap:  PCP Gardisil: Influenza:  10/2011 Zostavax:  With PCP  Bone density:never Colonoscopy: 2012 wnl: Dr. Carman Ching.  Next colonoscopy due 2017  Cholesterol check: 2014 wnl on medication  Family History  Problem Relation Age of Onset  . Diabetes Mother   . Hypertension Mother   . Seizures Mother     epilepsy  . Stroke Mother     TIA's  . Thyroid disease Mother      hypothyroid  . Colon cancer Father   . Diabetes Father   . Hypertension Father   . Diabetes Sister   . Hypertension Sister     There are no active problems to display for this patient.  Past Medical History  Diagnosis Date  . Hypertension   . Urinary incontinence     Past Surgical History  Procedure Laterality Date  . Abdominal hysterectomy  04/2010    TAH/BSO/A & P repair--Dr. Edward Jolly  . Tvt/sacrocolpopexy  04/2010    Dr. Edward Jolly  . Cervical disc surgery      Dr. Gerlene Fee  . Dilation and curettage of uterus    . Breast surgery  08/2012    Breast lift  . Tummy tuck  08/2012  . Cosmetic surgery  10/2012    eye lift  . Tubal ligation      ALLERGIES: Darvocet  Current Outpatient Prescriptions  Medication Sig Dispense Refill  . atorvastatin (LIPITOR) 10 MG tablet Take 10 mg by mouth daily. Takes 1 tablet every other day      . lisinopril (PRINIVIL,ZESTRIL) 10 MG tablet Take 10 mg by mouth daily.  Takes 1/2 tablet daily       No current facility-administered medications for this visit.     ROS:  Pertinent items are noted in HPI.  SOCIAL HISTORY:  Married.   PHYSICAL EXAMINATION:    BP 120/60  Pulse 64  Ht 5' 1.5" (1.562 m)  Wt 122 lb (55.339 kg)  BMI 22.68 kg/m2   Wt Readings from Last 3 Encounters:  10/25/12 122 lb (55.339 kg)     Ht Readings from Last 3 Encounters:  10/25/12 5' 1.5" (1.562 m)    General appearance: alert, cooperative and appears stated age.  Very bright affect.  Head: Normocephalic, without obvious abnormality, atraumatic Neck: no adenopathy, supple, symmetrical, trachea midline and thyroid not enlarged, symmetric, no tenderness/mass/nodules Lungs: clear to auscultation bilaterally Breasts: Scarring consistent with reduction type scars.  Inspection negative, No nipple retraction or dimpling, No nipple discharge or bleeding, No axillary or supraclavicular adenopathy, Normal to palpation without dominant masses Heart: regular rate and  rhythm Abdomen: Umbilical an transverse lower abdominal incisions intact, soft, non-tender; no masses,  no organomegaly Extremities: extremities normal, atraumatic, no cyanosis or edema Skin: Skin color, texture, turgor normal. No rashes or lesions Lymph nodes: Cervical, supraclavicular, and axillary nodes normal. No abnormal inguinal nodes palpated Neurologic: Grossly normal  Pelvic: External genitalia:  no lesions              Urethra:  normal appearing urethra with no masses, tenderness or lesions              Bartholins and Skenes: normal                 Vagina: normal appearing vagina with normal color and discharge, no lesions.  Good support.  No visible or palpable mesh.              Cervix:  absent              Pap and high risk HPV testing done: no.            Bimanual Exam:  Uterus:   absent                                      Adnexa:  no masses                                      Rectovaginal: Confirms                                      Anus:  normal sphincter tone, no lesions  ASSESSMENT  Normal gynecologic exam. Overactive bladder syndrome.  Status post recent breast lift and abdominoplasty.  PLAN  Mammogram in October at [facility].  Order placed.  Patient will check with her plastic surgeon to see when she can proceed with mammogram Bone density screening after turns 67 year old.  Pap smear and high risk HPV testing not indicated.  Sanctura XR ordered.  See Epic orders.  I discussed potential side effects.  Discussed physical therapy for improved bladder control, but the patient currently declines.  Return annually or prn   An After Visit Summary was printed and given to the patient.

## 2012-10-26 ENCOUNTER — Encounter: Payer: Self-pay | Admitting: Obstetrics and Gynecology

## 2012-12-01 DIAGNOSIS — I781 Nevus, non-neoplastic: Secondary | ICD-10-CM | POA: Diagnosis not present

## 2012-12-01 DIAGNOSIS — L821 Other seborrheic keratosis: Secondary | ICD-10-CM | POA: Diagnosis not present

## 2012-12-01 DIAGNOSIS — D239 Other benign neoplasm of skin, unspecified: Secondary | ICD-10-CM | POA: Diagnosis not present

## 2012-12-05 DIAGNOSIS — Z0000 Encounter for general adult medical examination without abnormal findings: Secondary | ICD-10-CM | POA: Diagnosis not present

## 2012-12-05 DIAGNOSIS — I10 Essential (primary) hypertension: Secondary | ICD-10-CM | POA: Diagnosis not present

## 2012-12-05 DIAGNOSIS — H1030 Unspecified acute conjunctivitis, unspecified eye: Secondary | ICD-10-CM | POA: Diagnosis not present

## 2012-12-05 DIAGNOSIS — E785 Hyperlipidemia, unspecified: Secondary | ICD-10-CM | POA: Diagnosis not present

## 2012-12-05 DIAGNOSIS — Z23 Encounter for immunization: Secondary | ICD-10-CM | POA: Diagnosis not present

## 2012-12-05 DIAGNOSIS — N951 Menopausal and female climacteric states: Secondary | ICD-10-CM | POA: Diagnosis not present

## 2012-12-07 ENCOUNTER — Other Ambulatory Visit: Payer: Self-pay

## 2013-01-23 DIAGNOSIS — Z780 Asymptomatic menopausal state: Secondary | ICD-10-CM | POA: Diagnosis not present

## 2013-02-21 DIAGNOSIS — H5310 Unspecified subjective visual disturbances: Secondary | ICD-10-CM | POA: Diagnosis not present

## 2013-03-30 DIAGNOSIS — IMO0002 Reserved for concepts with insufficient information to code with codable children: Secondary | ICD-10-CM | POA: Diagnosis not present

## 2013-06-19 ENCOUNTER — Telehealth: Payer: Self-pay

## 2013-06-19 NOTE — Telephone Encounter (Signed)
Spoke with patient. Advised only appointment available tomorrow at 11:00am with Dr.Silva (time per Gay Filler). Patient agreeable to date and time. Appointment scheduled.  Routing to provider for final review. Patient agreeable to disposition. Will close encounter

## 2013-06-19 NOTE — Telephone Encounter (Signed)
Spoke with patient. Patient states that over the weekend her vagina began to feel irritated and was "itching like crazy." Patient took Diflucan the night before last and has been using Monistat cream. Patient is still having slight itching but is now having tenderness in her lower abdomin close to her "bikini area." "I just haven't been feeling well." Has been nauseated today but has not thrown up. Denies fevers, chills, and discharge. Is having left sided back pain. Is not currently taking any medication for pain. Patient would like to come in to see Dr.Silva today. Advised Dr.Silva is out of the office today. Patient requesting early morning appointment with Dr.Silva tomorrow. Advised Dr.Silva will be in surgery tomorrow morning. Offered 11:15am appointment with Regina Eck CNM but patient declines stating that she would like to be seen earlier in the morning. Advised would have to check with providers and look at the scheduling to see about availability for tomorrow morning and give patient a call back. Patient agreeable.

## 2013-06-20 ENCOUNTER — Ambulatory Visit (INDEPENDENT_AMBULATORY_CARE_PROVIDER_SITE_OTHER): Payer: Medicare Other | Admitting: Obstetrics and Gynecology

## 2013-06-20 ENCOUNTER — Encounter: Payer: Self-pay | Admitting: Obstetrics and Gynecology

## 2013-06-20 VITALS — BP 120/70 | HR 76 | Ht 61.5 in | Wt 125.4 lb

## 2013-06-20 DIAGNOSIS — N952 Postmenopausal atrophic vaginitis: Secondary | ICD-10-CM

## 2013-06-20 DIAGNOSIS — M25559 Pain in unspecified hip: Secondary | ICD-10-CM

## 2013-06-20 DIAGNOSIS — K5900 Constipation, unspecified: Secondary | ICD-10-CM | POA: Diagnosis not present

## 2013-06-20 DIAGNOSIS — R109 Unspecified abdominal pain: Secondary | ICD-10-CM | POA: Diagnosis not present

## 2013-06-20 MED ORDER — ESTROGENS, CONJUGATED 0.625 MG/GM VA CREA: 1.0000 | TOPICAL_CREAM | Freq: Every day | VAGINAL | Status: DC

## 2013-06-20 NOTE — Progress Notes (Signed)
Patient ID: Erin Herring, female   DOB: 03-Feb-1945, 68 y.o.   MRN: [id number] GYNECOLOGY VISIT  PCP:  Maury Dus, MD  Referring provider:   HPI: 68 y.o.   Married  Caucasian  female   [phone] with No LMP recorded. Patient has had a hysterectomy.   here for  Vaginal irritation and lower pelvic and back discomfort.   Sexual activity precipitated the discomfort from digital penetration.   No vaginal bleeding.  No discharge. No dysuria. No odor or cloudy urine.   Also having constipation. On a new diet cutting down whole grains.   Not using any estrogen therapy.  Using some Monistat for external irritation.   Urine:  negative  GYNECOLOGIC HISTORY: No LMP recorded. Patient has had a hysterectomy. Sexually active:  rarely Partner preference: female Contraception:   hysterectomy Menopausal hormone therapy: no DES exposure:   no Blood transfusions:  no Sexually transmitted diseases:   no GYN procedures and prior surgeries: TAH/BSO extensive lysis of adhesions, McCall culdoplasty, abdominal sacrocolpopexy, TVT sling with cystoscopy, anterior and posterior colporrhaphy, incisional revision - 04/07/10   Last mammogram: 01/2012 wnl:[facility]--Pt. To have mammogram in 08/2012 ( has to wait due to breast augmentation)                Last pap and high risk HPV testing:  07/2011 wnl  History of abnormal pap smear:  no   OB History   Grav Para Term Preterm Abortions TAB SAB Ect Mult Living   5 3 3  2  2   3        LIFESTYLE: Exercise:  Water aerobics             Tobacco:  no Alcohol:    1-2 drinks per week Drug use:  no  There are no active problems to display for this patient.   Past Medical History  Diagnosis Date  . Hypertension   . Urinary incontinence     Past Surgical History  Procedure Laterality Date  . Abdominal hysterectomy  04/2010    TAH/BSO/A & P repair--Dr. Quincy Simmonds  . Tvt/sacrocolpopexy  04/2010    Dr. Quincy Simmonds  . Cervical disc surgery      Dr. Hal Neer  .  Dilation and curettage of uterus    . Breast surgery  08/2012    Breast lift  . Tummy tuck  08/2012  . Cosmetic surgery  10/2012    eye lift  . Tubal ligation    . Tubal reanastomosis      Current Outpatient Prescriptions  Medication Sig Dispense Refill  . lactobacillus acidophilus (BACID) TABS tablet Take 2 tablets by mouth at bedtime.      Marland Kitchen atorvastatin (LIPITOR) 10 MG tablet Take 40 mg by mouth. Takes 40mg  twice weekly      . lisinopril (PRINIVIL,ZESTRIL) 10 MG tablet Take 10 mg by mouth daily. Takes 1/2 tablet daily       No current facility-administered medications for this visit.     ALLERGIES: Darvocet and Detrol la  Family History  Problem Relation Age of Onset  . Diabetes Mother   . Hypertension Mother   . Seizures Mother     epilepsy  . Stroke Mother     TIA's  . Thyroid disease Mother     hypothyroid  . Colon cancer Father   . Diabetes Father   . Hypertension Father   . Diabetes Sister   . Hypertension Sister     History   Social  History  . Marital Status: Married    Spouse Name: N/A    Number of Children: N/A  . Years of Education: N/A   Occupational History  . Not on file.   Social History Main Topics  . Smoking status: Never Smoker   . Smokeless tobacco: Not on file  . Alcohol Use: Yes     Comment: 1-2 glasses of wine/beer per week  . Drug Use: No  . Sexual Activity: Yes    Partners: Male    Birth Control/ Protection: Surgical     Comment: TAH/BSO   Other Topics Concern  . Not on file   Social History Narrative  . No narrative on file    ROS:  Pertinent items are noted in HPI.  PHYSICAL EXAMINATION:    BP 120/70  Pulse 76  Ht 5' 1.5" (1.562 m)  Wt 125 lb 6.4 oz (56.881 kg)  BMI 23.31 kg/m2   Wt Readings from Last 3 Encounters:  06/20/13 125 lb 6.4 oz (56.881 kg)  10/25/12 122 lb (55.339 kg)     Ht Readings from Last 3 Encounters:  06/20/13 5' 1.5" (1.562 m)  10/25/12 5' 1.5" (1.562 m)    General appearance: alert,  cooperative and appears stated age Abdomen: soft, non-tender; no masses,  no organomegaly Neurologic: Grossly normal  Pelvic: External genitalia:  Slit of the right vulva, consistent with possible yeast.               Urethra:  normal appearing urethra with no masses, tenderness or lesions.  No caruncle, just eversion of the mucosa noted. (Asymptomatic.)              Bartholins and Skenes: normal                 Vagina: normal appearing vagina with normal color and discharge, no lesions, no graft exposed.              Cervix: absent                 Bimanual Exam:  Uterus:  uterus is absent                                      Adnexa: no masses                                      Good vaginal support.                                       ASSESSMENT  Atrophy of vagina.  Yeast vulvitis.  Pelvic pain.  Doubt UTI. Constipation.   PLAN  Premarin vaginal cream.  Discussed risk of thromboembolic events. Will do mammogram this summer after July 2015. Urine culture.  Miralax daily.  Will do OTC Monistat and Hydrocortisone cream to vulva. Follow up prn.   25 minutes face to face time of which over 50% was spent in counseling.    An After Visit Summary was printed and given to the patient.

## 2013-06-21 LAB — CULTURE, URINE COMPREHENSIVE
Colony Count: NO GROWTH
Organism ID, Bacteria: NO GROWTH

## 2013-08-31 DIAGNOSIS — Z1231 Encounter for screening mammogram for malignant neoplasm of breast: Secondary | ICD-10-CM | POA: Diagnosis not present

## 2013-10-12 ENCOUNTER — Encounter: Payer: Self-pay | Admitting: Obstetrics and Gynecology

## 2013-10-26 ENCOUNTER — Ambulatory Visit: Payer: Managed Care, Other (non HMO) | Admitting: Obstetrics and Gynecology

## 2013-11-02 ENCOUNTER — Encounter: Payer: Self-pay | Admitting: Obstetrics and Gynecology

## 2013-11-02 ENCOUNTER — Ambulatory Visit: Payer: Medicare Other | Admitting: Obstetrics and Gynecology

## 2013-11-02 ENCOUNTER — Telehealth: Payer: Self-pay | Admitting: Obstetrics and Gynecology

## 2013-11-02 NOTE — Telephone Encounter (Signed)
Pt cancelled appt for aex today because of the medicare fee.

## 2013-11-02 NOTE — Telephone Encounter (Signed)
Thank you for the update!

## 2013-12-03 ENCOUNTER — Encounter: Payer: Self-pay | Admitting: Obstetrics and Gynecology

## 2013-12-11 DIAGNOSIS — I10 Essential (primary) hypertension: Secondary | ICD-10-CM | POA: Diagnosis not present

## 2013-12-11 DIAGNOSIS — Z23 Encounter for immunization: Secondary | ICD-10-CM | POA: Diagnosis not present

## 2013-12-11 DIAGNOSIS — E780 Pure hypercholesterolemia: Secondary | ICD-10-CM | POA: Diagnosis not present

## 2013-12-11 DIAGNOSIS — Z0000 Encounter for general adult medical examination without abnormal findings: Secondary | ICD-10-CM | POA: Diagnosis not present

## 2014-02-27 ENCOUNTER — Other Ambulatory Visit: Payer: Self-pay | Admitting: Gastroenterology

## 2014-02-27 DIAGNOSIS — R1032 Left lower quadrant pain: Secondary | ICD-10-CM

## 2014-03-04 ENCOUNTER — Other Ambulatory Visit: Payer: Managed Care, Other (non HMO)

## 2014-06-20 DIAGNOSIS — E780 Pure hypercholesterolemia: Secondary | ICD-10-CM | POA: Diagnosis not present

## 2014-06-20 DIAGNOSIS — N3000 Acute cystitis without hematuria: Secondary | ICD-10-CM | POA: Diagnosis not present

## 2014-06-20 DIAGNOSIS — I10 Essential (primary) hypertension: Secondary | ICD-10-CM | POA: Diagnosis not present

## 2014-06-20 DIAGNOSIS — R252 Cramp and spasm: Secondary | ICD-10-CM | POA: Diagnosis not present

## 2014-07-29 ENCOUNTER — Other Ambulatory Visit: Payer: Self-pay

## 2014-07-29 ENCOUNTER — Encounter: Payer: Self-pay | Admitting: Obstetrics and Gynecology

## 2014-07-31 NOTE — Telephone Encounter (Signed)
Immunization record updated, patient notified via mychart. Will close encounter.

## 2014-08-22 DIAGNOSIS — E780 Pure hypercholesterolemia: Secondary | ICD-10-CM | POA: Diagnosis not present

## 2014-09-06 DIAGNOSIS — Z1231 Encounter for screening mammogram for malignant neoplasm of breast: Secondary | ICD-10-CM | POA: Diagnosis not present

## 2015-02-12 DIAGNOSIS — M8580 Other specified disorders of bone density and structure, unspecified site: Secondary | ICD-10-CM | POA: Diagnosis not present

## 2015-02-12 DIAGNOSIS — E7800 Pure hypercholesterolemia, unspecified: Secondary | ICD-10-CM | POA: Diagnosis not present

## 2015-02-12 DIAGNOSIS — H9311 Tinnitus, right ear: Secondary | ICD-10-CM | POA: Diagnosis not present

## 2015-02-12 DIAGNOSIS — I10 Essential (primary) hypertension: Secondary | ICD-10-CM | POA: Diagnosis not present

## 2015-02-12 DIAGNOSIS — Z23 Encounter for immunization: Secondary | ICD-10-CM | POA: Diagnosis not present

## 2015-02-12 DIAGNOSIS — Z0000 Encounter for general adult medical examination without abnormal findings: Secondary | ICD-10-CM | POA: Diagnosis not present

## 2015-02-12 DIAGNOSIS — Z1389 Encounter for screening for other disorder: Secondary | ICD-10-CM | POA: Diagnosis not present

## 2015-02-18 DIAGNOSIS — H903 Sensorineural hearing loss, bilateral: Secondary | ICD-10-CM | POA: Diagnosis not present

## 2015-02-18 DIAGNOSIS — H9313 Tinnitus, bilateral: Secondary | ICD-10-CM | POA: Diagnosis not present

## 2015-03-20 DIAGNOSIS — M8589 Other specified disorders of bone density and structure, multiple sites: Secondary | ICD-10-CM | POA: Diagnosis not present

## 2015-03-20 DIAGNOSIS — M859 Disorder of bone density and structure, unspecified: Secondary | ICD-10-CM | POA: Diagnosis not present

## 2015-07-07 DIAGNOSIS — W57XXXA Bitten or stung by nonvenomous insect and other nonvenomous arthropods, initial encounter: Secondary | ICD-10-CM | POA: Diagnosis not present

## 2015-07-07 DIAGNOSIS — S30861A Insect bite (nonvenomous) of abdominal wall, initial encounter: Secondary | ICD-10-CM | POA: Diagnosis not present

## 2015-07-07 DIAGNOSIS — R21 Rash and other nonspecific skin eruption: Secondary | ICD-10-CM | POA: Diagnosis not present

## 2015-08-14 DIAGNOSIS — I10 Essential (primary) hypertension: Secondary | ICD-10-CM | POA: Diagnosis not present

## 2015-08-14 DIAGNOSIS — E7800 Pure hypercholesterolemia, unspecified: Secondary | ICD-10-CM | POA: Diagnosis not present

## 2015-09-26 DIAGNOSIS — Z1231 Encounter for screening mammogram for malignant neoplasm of breast: Secondary | ICD-10-CM | POA: Diagnosis not present

## 2015-10-08 ENCOUNTER — Encounter: Payer: Self-pay | Admitting: Obstetrics and Gynecology

## 2015-10-20 DIAGNOSIS — E7800 Pure hypercholesterolemia, unspecified: Secondary | ICD-10-CM | POA: Diagnosis not present

## 2016-02-16 DIAGNOSIS — I10 Essential (primary) hypertension: Secondary | ICD-10-CM | POA: Diagnosis not present

## 2016-02-16 DIAGNOSIS — Z0000 Encounter for general adult medical examination without abnormal findings: Secondary | ICD-10-CM | POA: Diagnosis not present

## 2016-02-16 DIAGNOSIS — R040 Epistaxis: Secondary | ICD-10-CM | POA: Diagnosis not present

## 2016-02-16 DIAGNOSIS — Z1389 Encounter for screening for other disorder: Secondary | ICD-10-CM | POA: Diagnosis not present

## 2016-02-16 DIAGNOSIS — Z23 Encounter for immunization: Secondary | ICD-10-CM | POA: Diagnosis not present

## 2016-02-16 DIAGNOSIS — E7800 Pure hypercholesterolemia, unspecified: Secondary | ICD-10-CM | POA: Diagnosis not present

## 2016-02-16 DIAGNOSIS — J309 Allergic rhinitis, unspecified: Secondary | ICD-10-CM | POA: Diagnosis not present

## 2016-03-11 DIAGNOSIS — R945 Abnormal results of liver function studies: Secondary | ICD-10-CM | POA: Diagnosis not present

## 2016-04-05 ENCOUNTER — Telehealth: Payer: Self-pay | Admitting: Obstetrics and Gynecology

## 2016-04-05 NOTE — Telephone Encounter (Signed)
Patient has some soreness in her right breast for the past month. Last seen 06/20/13.

## 2016-04-05 NOTE — Telephone Encounter (Signed)
Spoke with patient. Patient states that for around 1 month she has been having pain in her right breast. Describes the pain as a "soreness feeling." Denies any changes to the breast such as redness or swelling. States she has not felt any masses in the right breast. Advised it is recommended that she be seen for further evaluation with a breast check. Patient is agreeable. Appointment scheduled for 04/07/2016 at 10 am with Dr.Silva. Patient is agreeable to date and time.  Routing to provider for final review. Patient agreeable to disposition. Will close encounter.

## 2016-04-07 ENCOUNTER — Ambulatory Visit (INDEPENDENT_AMBULATORY_CARE_PROVIDER_SITE_OTHER): Payer: Medicare Other | Admitting: Obstetrics and Gynecology

## 2016-04-07 ENCOUNTER — Encounter: Payer: Self-pay | Admitting: Obstetrics and Gynecology

## 2016-04-07 VITALS — BP 130/68 | HR 66 | Ht 61.5 in | Wt 125.0 lb

## 2016-04-07 DIAGNOSIS — N644 Mastodynia: Secondary | ICD-10-CM

## 2016-04-07 NOTE — Progress Notes (Signed)
Scheduled patient while in office for bilateral diagnostic mammogram with bilateral ultrasounds at [facility] today 04/07/2016 at 12:45 pm. Patient is agreeable to date and time. Patient placed in mammogram hold.

## 2016-04-07 NOTE — Progress Notes (Signed)
GYNECOLOGY  VISIT   HPI: 71 y.o.   Married  Caucasian  female   [phone] with No LMP recorded. Patient has had a hysterectomy.   here for right breast pain for one month. Patient does not feel a mass or any nipple discharge.  Pain for one month and breast feels full.   Wearing a garment for breast support, but this is not new.  No trauma to breast.   Can also have bilateral pain on her chest wall sides, left more than right.  This does not radiate down arm or into her jaw.  No associated nausea.   Not using any vaginal estrogen cream. Thought she was gaining weight so she stopped.  Patient being followed by PCP for LFTs in the past 3 months. Just about 20 points above normal.  Had this rechecked and one normalized and the other enzyme was a few points high.  She has an appointment for follow up.   GYNECOLOGIC HISTORY: No LMP recorded. Patient has had a hysterectomy. Contraception:  Hysterectomy Menopausal hormone therapy:  none Last mammogram:  09/26/15 Erin Herring, Erin Herring Last pap smear:   07/2011 wnl;03-20-10 Neg    OB History    Gravida Para Term Preterm AB Living   5 3 3   2 3    SAB TAB Ectopic Multiple Live Births   2                 There are no active problems to display for this patient.   Past Medical History:  Diagnosis Date  . Hypertension   . Urinary incontinence     Past Surgical History:  Procedure Laterality Date  . ABDOMINAL HYSTERECTOMY  04/2010   TAH/BSO/A & P repair--Dr. Quincy Herring  . BREAST SURGERY  08/2012   Breast lift  . CERVICAL DISC SURGERY     Dr. Hal Herring  . COSMETIC SURGERY  10/2012   eye lift  . DILATION AND CURETTAGE OF UTERUS    . TUBAL LIGATION    . tubal reanastomosis    . tummy tuck  08/2012  . TVT/Sacrocolpopexy  04/2010   Dr. Quincy Herring    Current Outpatient Prescriptions  Medication Sig Dispense Refill  . lisinopril (PRINIVIL,ZESTRIL) 5 MG tablet Patient takes 1/2 tablet daily    . conjugated estrogens (PREMARIN) vaginal cream  Place 1 Applicatorful vaginally daily. Use 1/2 g vaginally every night at bed time for the first 2 weeks, then use 1/2 g vaginally two or three times per week as needed to maintain symptom relief. (Patient not taking: Reported on 04/07/2016) 60 g 0   No current facility-administered medications for this visit.      ALLERGIES: Darvocet [propoxyphene n-acetaminophen] and Detrol la [tolterodine tartrate er]  Family History  Problem Relation Age of Onset  . Diabetes Mother   . Hypertension Mother   . Seizures Mother     epilepsy  . Stroke Mother     TIA's  . Thyroid disease Mother     hypothyroid  . Colon cancer Father   . Diabetes Father   . Hypertension Father   . Diabetes Sister   . Hypertension Sister     Social History   Social History  . Marital status: Married    Spouse name: N/A  . Number of children: N/A  . Years of education: N/A   Occupational History  . Not on file.   Social History Main Topics  . Smoking status: Never Smoker  . Smokeless tobacco: Never  Used  . Alcohol use Yes     Comment: 1-2 glasses of wine/beer per week  . Drug use: No  . Sexual activity: No     Comment: TAH/BSO   Other Topics Concern  . Not on file   Social History Narrative  . No narrative on file    ROS:  Pertinent items are noted in HPI.  PHYSICAL EXAMINATION:    BP 130/68 (BP Location: Right Arm, Patient Position: Sitting, Cuff Size: Normal)   Pulse 66   Ht 5' 1.5" (1.562 m)   Wt 125 lb (56.7 kg)   BMI 23.24 kg/m     General appearance: alert, cooperative and appears stated age   Breasts:  Scars consistent with bilateral breast reduction, no masses, No nipple retraction or dimpling, No nipple discharge or bleeding, No axillary or supraclavicular adenopathy.  Possible fibrocystic change right breast at 10:00 position with tenderness in this region. No specific mass.    Chaperone was present for exam.  ASSESSMENT  Bilateral breast pain/chest wall pain.  Fibrocystic  change in right breast.  Status post bilateral breast reduction.   PLAN  Proceed with diagnostic bilateral mammogram and ultrasound at [facility]  Return for routine annual exam appointment.     An After Visit Summary was printed and given to the patient.  ___15___ minutes face to face time of which over 50% was spent in counseling.

## 2016-04-12 DIAGNOSIS — R945 Abnormal results of liver function studies: Secondary | ICD-10-CM | POA: Diagnosis not present

## 2016-04-29 ENCOUNTER — Encounter: Payer: Self-pay | Admitting: Obstetrics and Gynecology

## 2016-08-25 DIAGNOSIS — M545 Low back pain: Secondary | ICD-10-CM | POA: Diagnosis not present

## 2016-08-25 DIAGNOSIS — R509 Fever, unspecified: Secondary | ICD-10-CM | POA: Diagnosis not present

## 2016-11-09 DIAGNOSIS — E7800 Pure hypercholesterolemia, unspecified: Secondary | ICD-10-CM | POA: Diagnosis not present

## 2017-01-14 DIAGNOSIS — Z23 Encounter for immunization: Secondary | ICD-10-CM | POA: Diagnosis not present

## 2017-03-28 DIAGNOSIS — I10 Essential (primary) hypertension: Secondary | ICD-10-CM | POA: Diagnosis not present

## 2017-03-28 DIAGNOSIS — E7800 Pure hypercholesterolemia, unspecified: Secondary | ICD-10-CM | POA: Diagnosis not present

## 2017-03-28 DIAGNOSIS — Z0000 Encounter for general adult medical examination without abnormal findings: Secondary | ICD-10-CM | POA: Diagnosis not present

## 2017-03-28 DIAGNOSIS — Z1211 Encounter for screening for malignant neoplasm of colon: Secondary | ICD-10-CM | POA: Diagnosis not present

## 2017-07-31 DIAGNOSIS — I10 Essential (primary) hypertension: Secondary | ICD-10-CM | POA: Diagnosis not present

## 2017-07-31 DIAGNOSIS — I160 Hypertensive urgency: Secondary | ICD-10-CM | POA: Diagnosis not present

## 2017-07-31 DIAGNOSIS — R51 Headache: Secondary | ICD-10-CM | POA: Diagnosis not present

## 2017-07-31 DIAGNOSIS — Z79899 Other long term (current) drug therapy: Secondary | ICD-10-CM | POA: Diagnosis not present

## 2017-08-03 DIAGNOSIS — R51 Headache: Secondary | ICD-10-CM | POA: Diagnosis not present

## 2017-08-03 DIAGNOSIS — I10 Essential (primary) hypertension: Secondary | ICD-10-CM | POA: Diagnosis not present

## 2017-08-03 DIAGNOSIS — Z09 Encounter for follow-up examination after completed treatment for conditions other than malignant neoplasm: Secondary | ICD-10-CM | POA: Diagnosis not present

## 2017-09-13 DIAGNOSIS — Z789 Other specified health status: Secondary | ICD-10-CM | POA: Diagnosis not present

## 2017-09-13 DIAGNOSIS — Z7689 Persons encountering health services in other specified circumstances: Secondary | ICD-10-CM | POA: Diagnosis not present

## 2017-09-13 DIAGNOSIS — I10 Essential (primary) hypertension: Secondary | ICD-10-CM | POA: Diagnosis not present

## 2017-09-13 DIAGNOSIS — E7800 Pure hypercholesterolemia, unspecified: Secondary | ICD-10-CM | POA: Diagnosis not present

## 2017-09-15 DIAGNOSIS — R300 Dysuria: Secondary | ICD-10-CM | POA: Diagnosis not present

## 2017-12-13 DIAGNOSIS — Z23 Encounter for immunization: Secondary | ICD-10-CM | POA: Diagnosis not present

## 2018-04-03 DIAGNOSIS — Z1211 Encounter for screening for malignant neoplasm of colon: Secondary | ICD-10-CM | POA: Diagnosis not present

## 2018-04-03 DIAGNOSIS — Z789 Other specified health status: Secondary | ICD-10-CM | POA: Diagnosis not present

## 2018-04-03 DIAGNOSIS — Z1389 Encounter for screening for other disorder: Secondary | ICD-10-CM | POA: Diagnosis not present

## 2018-04-03 DIAGNOSIS — E7800 Pure hypercholesterolemia, unspecified: Secondary | ICD-10-CM | POA: Diagnosis not present

## 2018-04-03 DIAGNOSIS — E569 Vitamin deficiency, unspecified: Secondary | ICD-10-CM | POA: Diagnosis not present

## 2018-04-03 DIAGNOSIS — E559 Vitamin D deficiency, unspecified: Secondary | ICD-10-CM | POA: Diagnosis not present

## 2018-04-03 DIAGNOSIS — M8580 Other specified disorders of bone density and structure, unspecified site: Secondary | ICD-10-CM | POA: Diagnosis not present

## 2018-04-03 DIAGNOSIS — I10 Essential (primary) hypertension: Secondary | ICD-10-CM | POA: Diagnosis not present

## 2018-04-03 DIAGNOSIS — Z0000 Encounter for general adult medical examination without abnormal findings: Secondary | ICD-10-CM | POA: Diagnosis not present

## 2018-05-13 DIAGNOSIS — M546 Pain in thoracic spine: Secondary | ICD-10-CM | POA: Diagnosis not present

## 2018-09-25 DIAGNOSIS — M8589 Other specified disorders of bone density and structure, multiple sites: Secondary | ICD-10-CM | POA: Diagnosis not present

## 2018-09-25 DIAGNOSIS — Z1231 Encounter for screening mammogram for malignant neoplasm of breast: Secondary | ICD-10-CM | POA: Diagnosis not present

## 2018-09-25 DIAGNOSIS — R29890 Loss of height: Secondary | ICD-10-CM | POA: Diagnosis not present

## 2018-09-25 DIAGNOSIS — Z90710 Acquired absence of both cervix and uterus: Secondary | ICD-10-CM | POA: Diagnosis not present

## 2018-09-25 DIAGNOSIS — Z780 Asymptomatic menopausal state: Secondary | ICD-10-CM | POA: Diagnosis not present

## 2018-10-05 DIAGNOSIS — E7800 Pure hypercholesterolemia, unspecified: Secondary | ICD-10-CM | POA: Diagnosis not present

## 2018-10-05 DIAGNOSIS — R300 Dysuria: Secondary | ICD-10-CM | POA: Diagnosis not present

## 2018-10-05 DIAGNOSIS — E569 Vitamin deficiency, unspecified: Secondary | ICD-10-CM | POA: Diagnosis not present

## 2018-10-05 DIAGNOSIS — M8580 Other specified disorders of bone density and structure, unspecified site: Secondary | ICD-10-CM | POA: Diagnosis not present

## 2018-10-05 DIAGNOSIS — I10 Essential (primary) hypertension: Secondary | ICD-10-CM | POA: Diagnosis not present

## 2018-10-05 DIAGNOSIS — Z789 Other specified health status: Secondary | ICD-10-CM | POA: Diagnosis not present

## 2018-10-06 DIAGNOSIS — E559 Vitamin D deficiency, unspecified: Secondary | ICD-10-CM | POA: Diagnosis not present

## 2018-10-06 DIAGNOSIS — R300 Dysuria: Secondary | ICD-10-CM | POA: Diagnosis not present

## 2018-10-06 DIAGNOSIS — E7800 Pure hypercholesterolemia, unspecified: Secondary | ICD-10-CM | POA: Diagnosis not present

## 2018-10-06 DIAGNOSIS — E569 Vitamin deficiency, unspecified: Secondary | ICD-10-CM | POA: Diagnosis not present

## 2018-10-13 DIAGNOSIS — Z23 Encounter for immunization: Secondary | ICD-10-CM | POA: Diagnosis not present

## 2019-02-12 ENCOUNTER — Ambulatory Visit: Payer: Medicare Other | Attending: Internal Medicine

## 2019-02-12 ENCOUNTER — Ambulatory Visit: Payer: Managed Care, Other (non HMO)

## 2019-02-12 DIAGNOSIS — Z23 Encounter for immunization: Secondary | ICD-10-CM | POA: Diagnosis not present

## 2019-02-12 NOTE — Progress Notes (Signed)
   Covid-19 Vaccination Clinic  Name:  Erin Herring    MRN: [id number] DOB: 16-Mar-1945  02/12/2019  [name] was observed post Covid-19 immunization for 15 minutes without incidence. She was provided with Vaccine Information Sheet and instruction to access the V-Safe system.   [name] was instructed to call 911 with any severe reactions post vaccine: Marland Kitchen Difficulty breathing  . Swelling of your face and throat  . A fast heartbeat  . A bad rash all over your body  . Dizziness and weakness

## 2019-03-02 ENCOUNTER — Ambulatory Visit: Payer: Managed Care, Other (non HMO)

## 2019-03-03 ENCOUNTER — Ambulatory Visit: Payer: Medicare Other | Attending: Internal Medicine

## 2019-03-03 DIAGNOSIS — Z23 Encounter for immunization: Secondary | ICD-10-CM | POA: Insufficient documentation

## 2019-03-03 NOTE — Progress Notes (Signed)
   Covid-19 Vaccination Clinic  Name:  Erin Herring    MRN: [id number] DOB: 1945/04/14  03/03/2019  [name] was observed post Covid-19 immunization for 15 minutes without incidence. She was provided with Vaccine Information Sheet and instruction to access the V-Safe system.   [name] was instructed to call 911 with any severe reactions post vaccine: Marland Kitchen Difficulty breathing  . Swelling of your face and throat  . A fast heartbeat  . A bad rash all over your body  . Dizziness and weakness    Immunizations Administered    Name Date Dose VIS Date Route   Pfizer COVID-19 Vaccine 03/03/2019 12:50 PM 0.3 mL 01/12/2019 Intramuscular   Manufacturer: [place]   Lot: [id number]   [place]: [id number]

## 2019-04-24 DIAGNOSIS — E569 Vitamin deficiency, unspecified: Secondary | ICD-10-CM | POA: Diagnosis not present

## 2019-04-24 DIAGNOSIS — K219 Gastro-esophageal reflux disease without esophagitis: Secondary | ICD-10-CM | POA: Diagnosis not present

## 2019-04-24 DIAGNOSIS — Z0000 Encounter for general adult medical examination without abnormal findings: Secondary | ICD-10-CM | POA: Diagnosis not present

## 2019-04-24 DIAGNOSIS — Z789 Other specified health status: Secondary | ICD-10-CM | POA: Diagnosis not present

## 2019-04-24 DIAGNOSIS — I10 Essential (primary) hypertension: Secondary | ICD-10-CM | POA: Diagnosis not present

## 2019-04-24 DIAGNOSIS — E7800 Pure hypercholesterolemia, unspecified: Secondary | ICD-10-CM | POA: Diagnosis not present

## 2019-04-24 DIAGNOSIS — H9311 Tinnitus, right ear: Secondary | ICD-10-CM | POA: Diagnosis not present

## 2019-04-24 DIAGNOSIS — H90A22 Sensorineural hearing loss, unilateral, left ear, with restricted hearing on the contralateral side: Secondary | ICD-10-CM | POA: Diagnosis not present

## 2019-04-24 DIAGNOSIS — Z1211 Encounter for screening for malignant neoplasm of colon: Secondary | ICD-10-CM | POA: Diagnosis not present

## 2019-05-08 DIAGNOSIS — Z789 Other specified health status: Secondary | ICD-10-CM | POA: Diagnosis not present

## 2019-05-08 DIAGNOSIS — Z1211 Encounter for screening for malignant neoplasm of colon: Secondary | ICD-10-CM | POA: Diagnosis not present

## 2019-05-08 DIAGNOSIS — H90A22 Sensorineural hearing loss, unilateral, left ear, with restricted hearing on the contralateral side: Secondary | ICD-10-CM | POA: Diagnosis not present

## 2019-05-08 DIAGNOSIS — E7800 Pure hypercholesterolemia, unspecified: Secondary | ICD-10-CM | POA: Diagnosis not present

## 2019-05-08 DIAGNOSIS — H9311 Tinnitus, right ear: Secondary | ICD-10-CM | POA: Diagnosis not present

## 2019-05-08 DIAGNOSIS — K219 Gastro-esophageal reflux disease without esophagitis: Secondary | ICD-10-CM | POA: Diagnosis not present

## 2019-05-08 DIAGNOSIS — I10 Essential (primary) hypertension: Secondary | ICD-10-CM | POA: Diagnosis not present

## 2019-05-08 DIAGNOSIS — Z0000 Encounter for general adult medical examination without abnormal findings: Secondary | ICD-10-CM | POA: Diagnosis not present

## 2019-05-08 DIAGNOSIS — E538 Deficiency of other specified B group vitamins: Secondary | ICD-10-CM | POA: Diagnosis not present

## 2019-05-08 DIAGNOSIS — E569 Vitamin deficiency, unspecified: Secondary | ICD-10-CM | POA: Diagnosis not present

## 2019-06-12 DIAGNOSIS — Z1211 Encounter for screening for malignant neoplasm of colon: Secondary | ICD-10-CM | POA: Diagnosis not present

## 2019-09-25 DIAGNOSIS — M542 Cervicalgia: Secondary | ICD-10-CM | POA: Diagnosis not present

## 2019-09-25 DIAGNOSIS — Z981 Arthrodesis status: Secondary | ICD-10-CM | POA: Diagnosis not present

## 2019-09-26 ENCOUNTER — Other Ambulatory Visit (HOSPITAL_COMMUNITY): Payer: Self-pay | Admitting: Family Medicine

## 2019-09-26 ENCOUNTER — Other Ambulatory Visit: Payer: Self-pay

## 2019-09-26 ENCOUNTER — Ambulatory Visit (HOSPITAL_COMMUNITY)
Admission: RE | Admit: 2019-09-26 | Discharge: 2019-09-26 | Disposition: A | Discharge status: Home / Self Care | Payer: Medicare Other | Source: Ambulatory Visit | Attending: Internal Medicine | Admitting: Internal Medicine

## 2019-09-26 DIAGNOSIS — R0989 Other specified symptoms and signs involving the circulatory and respiratory systems: Secondary | ICD-10-CM | POA: Diagnosis not present

## 2019-10-01 DIAGNOSIS — Z1231 Encounter for screening mammogram for malignant neoplasm of breast: Secondary | ICD-10-CM | POA: Diagnosis not present

## 2019-10-30 DIAGNOSIS — R300 Dysuria: Secondary | ICD-10-CM | POA: Diagnosis not present

## 2019-11-12 DIAGNOSIS — R300 Dysuria: Secondary | ICD-10-CM | POA: Diagnosis not present

## 2019-11-12 DIAGNOSIS — E559 Vitamin D deficiency, unspecified: Secondary | ICD-10-CM | POA: Diagnosis not present

## 2019-11-12 DIAGNOSIS — E7800 Pure hypercholesterolemia, unspecified: Secondary | ICD-10-CM | POA: Diagnosis not present

## 2019-11-12 DIAGNOSIS — I10 Essential (primary) hypertension: Secondary | ICD-10-CM | POA: Diagnosis not present

## 2019-11-12 DIAGNOSIS — H9311 Tinnitus, right ear: Secondary | ICD-10-CM | POA: Diagnosis not present

## 2019-11-12 DIAGNOSIS — Z789 Other specified health status: Secondary | ICD-10-CM | POA: Diagnosis not present

## 2019-11-12 DIAGNOSIS — E569 Vitamin deficiency, unspecified: Secondary | ICD-10-CM | POA: Diagnosis not present

## 2019-11-12 DIAGNOSIS — R899 Unspecified abnormal finding in specimens from other organs, systems and tissues: Secondary | ICD-10-CM | POA: Diagnosis not present

## 2019-11-12 DIAGNOSIS — K219 Gastro-esophageal reflux disease without esophagitis: Secondary | ICD-10-CM | POA: Diagnosis not present

## 2019-11-12 DIAGNOSIS — H90A22 Sensorineural hearing loss, unilateral, left ear, with restricted hearing on the contralateral side: Secondary | ICD-10-CM | POA: Diagnosis not present

## 2019-11-19 DIAGNOSIS — Z789 Other specified health status: Secondary | ICD-10-CM | POA: Diagnosis not present

## 2019-11-19 DIAGNOSIS — K219 Gastro-esophageal reflux disease without esophagitis: Secondary | ICD-10-CM | POA: Diagnosis not present

## 2019-11-19 DIAGNOSIS — R300 Dysuria: Secondary | ICD-10-CM | POA: Diagnosis not present

## 2019-11-19 DIAGNOSIS — H9311 Tinnitus, right ear: Secondary | ICD-10-CM | POA: Diagnosis not present

## 2019-11-19 DIAGNOSIS — E569 Vitamin deficiency, unspecified: Secondary | ICD-10-CM | POA: Diagnosis not present

## 2019-11-19 DIAGNOSIS — E7800 Pure hypercholesterolemia, unspecified: Secondary | ICD-10-CM | POA: Diagnosis not present

## 2019-11-19 DIAGNOSIS — R899 Unspecified abnormal finding in specimens from other organs, systems and tissues: Secondary | ICD-10-CM | POA: Diagnosis not present

## 2019-11-19 DIAGNOSIS — H90A22 Sensorineural hearing loss, unilateral, left ear, with restricted hearing on the contralateral side: Secondary | ICD-10-CM | POA: Diagnosis not present

## 2019-11-19 DIAGNOSIS — I10 Essential (primary) hypertension: Secondary | ICD-10-CM | POA: Diagnosis not present

## 2019-12-05 DIAGNOSIS — N390 Urinary tract infection, site not specified: Secondary | ICD-10-CM | POA: Diagnosis not present

## 2019-12-07 DIAGNOSIS — Z23 Encounter for immunization: Secondary | ICD-10-CM | POA: Diagnosis not present

## 2020-04-30 DIAGNOSIS — D225 Melanocytic nevi of trunk: Secondary | ICD-10-CM | POA: Diagnosis not present

## 2020-04-30 DIAGNOSIS — D485 Neoplasm of uncertain behavior of skin: Secondary | ICD-10-CM | POA: Diagnosis not present

## 2020-05-13 DIAGNOSIS — D485 Neoplasm of uncertain behavior of skin: Secondary | ICD-10-CM | POA: Diagnosis not present

## 2020-05-14 DIAGNOSIS — L98428 Non-pressure chronic ulcer of back with other specified severity: Secondary | ICD-10-CM | POA: Diagnosis not present

## 2020-05-15 DIAGNOSIS — I10 Essential (primary) hypertension: Secondary | ICD-10-CM | POA: Diagnosis not present

## 2020-05-15 DIAGNOSIS — H9311 Tinnitus, right ear: Secondary | ICD-10-CM | POA: Diagnosis not present

## 2020-05-15 DIAGNOSIS — R899 Unspecified abnormal finding in specimens from other organs, systems and tissues: Secondary | ICD-10-CM | POA: Diagnosis not present

## 2020-05-15 DIAGNOSIS — E7800 Pure hypercholesterolemia, unspecified: Secondary | ICD-10-CM | POA: Diagnosis not present

## 2020-05-15 DIAGNOSIS — Z789 Other specified health status: Secondary | ICD-10-CM | POA: Diagnosis not present

## 2020-05-15 DIAGNOSIS — E569 Vitamin deficiency, unspecified: Secondary | ICD-10-CM | POA: Diagnosis not present

## 2020-05-15 DIAGNOSIS — K219 Gastro-esophageal reflux disease without esophagitis: Secondary | ICD-10-CM | POA: Diagnosis not present

## 2020-05-15 DIAGNOSIS — H90A22 Sensorineural hearing loss, unilateral, left ear, with restricted hearing on the contralateral side: Secondary | ICD-10-CM | POA: Diagnosis not present

## 2020-05-15 DIAGNOSIS — R748 Abnormal levels of other serum enzymes: Secondary | ICD-10-CM | POA: Diagnosis not present

## 2020-07-20 DIAGNOSIS — U071 COVID-19: Secondary | ICD-10-CM | POA: Diagnosis not present

## 2020-07-20 DIAGNOSIS — Z20822 Contact with and (suspected) exposure to covid-19: Secondary | ICD-10-CM | POA: Diagnosis not present

## 2020-07-20 DIAGNOSIS — R5383 Other fatigue: Secondary | ICD-10-CM | POA: Diagnosis not present

## 2020-07-20 DIAGNOSIS — J029 Acute pharyngitis, unspecified: Secondary | ICD-10-CM | POA: Diagnosis not present

## 2020-08-21 DIAGNOSIS — H90A22 Sensorineural hearing loss, unilateral, left ear, with restricted hearing on the contralateral side: Secondary | ICD-10-CM | POA: Diagnosis not present

## 2020-08-21 DIAGNOSIS — R899 Unspecified abnormal finding in specimens from other organs, systems and tissues: Secondary | ICD-10-CM | POA: Diagnosis not present

## 2020-08-21 DIAGNOSIS — H9311 Tinnitus, right ear: Secondary | ICD-10-CM | POA: Diagnosis not present

## 2020-08-21 DIAGNOSIS — E7800 Pure hypercholesterolemia, unspecified: Secondary | ICD-10-CM | POA: Diagnosis not present

## 2020-08-21 DIAGNOSIS — E569 Vitamin deficiency, unspecified: Secondary | ICD-10-CM | POA: Diagnosis not present

## 2020-08-21 DIAGNOSIS — K219 Gastro-esophageal reflux disease without esophagitis: Secondary | ICD-10-CM | POA: Diagnosis not present

## 2020-08-21 DIAGNOSIS — I10 Essential (primary) hypertension: Secondary | ICD-10-CM | POA: Diagnosis not present

## 2020-08-21 DIAGNOSIS — Z789 Other specified health status: Secondary | ICD-10-CM | POA: Diagnosis not present

## 2020-10-01 DIAGNOSIS — Z8719 Personal history of other diseases of the digestive system: Secondary | ICD-10-CM | POA: Diagnosis not present

## 2020-10-01 DIAGNOSIS — R109 Unspecified abdominal pain: Secondary | ICD-10-CM | POA: Diagnosis not present

## 2020-10-07 DIAGNOSIS — Z1231 Encounter for screening mammogram for malignant neoplasm of breast: Secondary | ICD-10-CM | POA: Diagnosis not present

## 2020-12-19 DIAGNOSIS — H90A22 Sensorineural hearing loss, unilateral, left ear, with restricted hearing on the contralateral side: Secondary | ICD-10-CM | POA: Diagnosis not present

## 2020-12-19 DIAGNOSIS — E7800 Pure hypercholesterolemia, unspecified: Secondary | ICD-10-CM | POA: Diagnosis not present

## 2020-12-19 DIAGNOSIS — Z789 Other specified health status: Secondary | ICD-10-CM | POA: Diagnosis not present

## 2020-12-19 DIAGNOSIS — E569 Vitamin deficiency, unspecified: Secondary | ICD-10-CM | POA: Diagnosis not present

## 2020-12-19 DIAGNOSIS — K219 Gastro-esophageal reflux disease without esophagitis: Secondary | ICD-10-CM | POA: Diagnosis not present

## 2020-12-19 DIAGNOSIS — H9311 Tinnitus, right ear: Secondary | ICD-10-CM | POA: Diagnosis not present

## 2020-12-19 DIAGNOSIS — I10 Essential (primary) hypertension: Secondary | ICD-10-CM | POA: Diagnosis not present

## 2020-12-19 DIAGNOSIS — R899 Unspecified abnormal finding in specimens from other organs, systems and tissues: Secondary | ICD-10-CM | POA: Diagnosis not present

## 2021-01-07 DIAGNOSIS — J019 Acute sinusitis, unspecified: Secondary | ICD-10-CM | POA: Diagnosis not present

## 2021-01-07 DIAGNOSIS — H5789 Other specified disorders of eye and adnexa: Secondary | ICD-10-CM | POA: Diagnosis not present

## 2021-02-24 DIAGNOSIS — R0989 Other specified symptoms and signs involving the circulatory and respiratory systems: Secondary | ICD-10-CM | POA: Diagnosis not present

## 2021-02-24 DIAGNOSIS — R1030 Lower abdominal pain, unspecified: Secondary | ICD-10-CM | POA: Diagnosis not present

## 2021-05-05 DIAGNOSIS — K219 Gastro-esophageal reflux disease without esophagitis: Secondary | ICD-10-CM | POA: Diagnosis not present

## 2021-05-05 DIAGNOSIS — J3489 Other specified disorders of nose and nasal sinuses: Secondary | ICD-10-CM | POA: Diagnosis not present

## 2021-06-15 DIAGNOSIS — R350 Frequency of micturition: Secondary | ICD-10-CM | POA: Diagnosis not present

## 2021-07-10 DIAGNOSIS — D485 Neoplasm of uncertain behavior of skin: Secondary | ICD-10-CM | POA: Diagnosis not present

## 2021-07-10 DIAGNOSIS — D2261 Melanocytic nevi of right upper limb, including shoulder: Secondary | ICD-10-CM | POA: Diagnosis not present

## 2021-07-10 DIAGNOSIS — L905 Scar conditions and fibrosis of skin: Secondary | ICD-10-CM | POA: Diagnosis not present

## 2021-07-10 DIAGNOSIS — D225 Melanocytic nevi of trunk: Secondary | ICD-10-CM | POA: Diagnosis not present

## 2021-07-10 DIAGNOSIS — Z1283 Encounter for screening for malignant neoplasm of skin: Secondary | ICD-10-CM | POA: Diagnosis not present

## 2022-01-06 DIAGNOSIS — I10 Essential (primary) hypertension: Secondary | ICD-10-CM | POA: Diagnosis not present

## 2022-01-14 DIAGNOSIS — I10 Essential (primary) hypertension: Secondary | ICD-10-CM | POA: Diagnosis not present

## 2022-01-19 ENCOUNTER — Encounter (HOSPITAL_BASED_OUTPATIENT_CLINIC_OR_DEPARTMENT_OTHER): Payer: Self-pay

## 2022-01-19 ENCOUNTER — Other Ambulatory Visit: Payer: Self-pay

## 2022-01-19 ENCOUNTER — Emergency Department (HOSPITAL_BASED_OUTPATIENT_CLINIC_OR_DEPARTMENT_OTHER)
Admission: EM | Admit: 2022-01-19 | Discharge: 2022-01-20 | Discharge status: Against Medical Advice | Payer: Medicare Other | Attending: Emergency Medicine | Admitting: Emergency Medicine

## 2022-01-19 DIAGNOSIS — Z5321 Procedure and treatment not carried out due to patient leaving prior to being seen by health care provider: Secondary | ICD-10-CM | POA: Insufficient documentation

## 2022-01-19 DIAGNOSIS — R030 Elevated blood-pressure reading, without diagnosis of hypertension: Secondary | ICD-10-CM | POA: Diagnosis not present

## 2022-01-19 DIAGNOSIS — Z79899 Other long term (current) drug therapy: Secondary | ICD-10-CM | POA: Insufficient documentation

## 2022-01-19 DIAGNOSIS — I10 Essential (primary) hypertension: Secondary | ICD-10-CM | POA: Diagnosis not present

## 2022-01-19 LAB — CBC
HCT: 39.2 % (ref 36.0–46.0)
Hemoglobin: 12.7 g/dL (ref 12.0–15.0)
MCH: 29.7 pg (ref 26.0–34.0)
MCHC: 32.4 g/dL (ref 30.0–36.0)
MCV: 91.6 fL (ref 80.0–100.0)
Platelets: 219 10*3/uL (ref 150–400)
RBC: 4.28 MIL/uL (ref 3.87–5.11)
RDW: 12.2 % (ref 11.5–15.5)
WBC: 4.4 10*3/uL (ref 4.0–10.5)
nRBC: 0 % (ref 0.0–0.2)

## 2022-01-19 LAB — BASIC METABOLIC PANEL
Anion gap: 10 (ref 5–15)
BUN: 21 mg/dL (ref 8–23)
CO2: 26 mmol/L (ref 22–32)
Calcium: 9.7 mg/dL (ref 8.9–10.3)
Chloride: 102 mmol/L (ref 98–111)
Creatinine, Ser: 0.84 mg/dL (ref 0.44–1.00)
GFR, Estimated: >60 mL/min (ref >60)
Glucose, Bld: 91 mg/dL (ref 70–99)
Potassium: 4 mmol/L (ref 3.5–5.1)
Sodium: 138 mmol/L (ref 135–145)

## 2022-01-19 LAB — TROPONIN I (HIGH SENSITIVITY): Troponin I (High Sensitivity): 5 ng/L (ref <18)

## 2022-01-19 NOTE — ED Triage Notes (Signed)
Patient here POV from Home.  Endorses HTN for approximately 1 Week. States it has been approximately 267 systolic and above but it has been rising. Takes Lisinopril typically but had dose increased from 10 to 20 mg.   History of Treated HTN. BP 211/113 shortly PTA.   NAD Noted during Triage. A&Ox4. GCS 15. Ambulatory.

## 2022-01-20 DIAGNOSIS — I10 Essential (primary) hypertension: Secondary | ICD-10-CM | POA: Diagnosis not present

## 2022-01-20 NOTE — ED Notes (Signed)
Called pt x 2 from lobby.No answer. Pt not found in lobby nor surrounding areas.

## 2022-01-20 NOTE — ED Notes (Signed)
Attempted to locate pt x 1 for reasses and vital update to no avail.

## 2022-01-20 NOTE — ED Notes (Signed)
Pt not found x 3

## 2022-01-28 DIAGNOSIS — I10 Essential (primary) hypertension: Secondary | ICD-10-CM | POA: Diagnosis not present

## 2022-02-11 DIAGNOSIS — H2513 Age-related nuclear cataract, bilateral: Secondary | ICD-10-CM | POA: Diagnosis not present

## 2022-02-11 DIAGNOSIS — H40033 Anatomical narrow angle, bilateral: Secondary | ICD-10-CM | POA: Diagnosis not present

## 2022-06-04 DIAGNOSIS — E569 Vitamin deficiency, unspecified: Secondary | ICD-10-CM | POA: Diagnosis not present

## 2022-06-04 DIAGNOSIS — I10 Essential (primary) hypertension: Secondary | ICD-10-CM | POA: Diagnosis not present

## 2022-06-04 DIAGNOSIS — E7800 Pure hypercholesterolemia, unspecified: Secondary | ICD-10-CM | POA: Diagnosis not present

## 2022-06-04 DIAGNOSIS — Z9181 History of falling: Secondary | ICD-10-CM | POA: Diagnosis not present

## 2022-06-04 DIAGNOSIS — Z1211 Encounter for screening for malignant neoplasm of colon: Secondary | ICD-10-CM | POA: Diagnosis not present

## 2022-06-04 DIAGNOSIS — Z0000 Encounter for general adult medical examination without abnormal findings: Secondary | ICD-10-CM | POA: Diagnosis not present

## 2022-06-04 DIAGNOSIS — M810 Age-related osteoporosis without current pathological fracture: Secondary | ICD-10-CM | POA: Diagnosis not present

## 2022-06-23 DIAGNOSIS — Z1212 Encounter for screening for malignant neoplasm of rectum: Secondary | ICD-10-CM | POA: Diagnosis not present

## 2022-06-23 DIAGNOSIS — Z1211 Encounter for screening for malignant neoplasm of colon: Secondary | ICD-10-CM | POA: Diagnosis not present

## 2022-09-03 DIAGNOSIS — Z9181 History of falling: Secondary | ICD-10-CM | POA: Diagnosis not present

## 2022-09-03 DIAGNOSIS — E7800 Pure hypercholesterolemia, unspecified: Secondary | ICD-10-CM | POA: Diagnosis not present

## 2022-09-03 DIAGNOSIS — M25521 Pain in right elbow: Secondary | ICD-10-CM | POA: Diagnosis not present

## 2022-09-03 DIAGNOSIS — I10 Essential (primary) hypertension: Secondary | ICD-10-CM | POA: Diagnosis not present

## 2023-02-27 DIAGNOSIS — L2389 Allergic contact dermatitis due to other agents: Secondary | ICD-10-CM | POA: Diagnosis not present

## 2023-03-31 DIAGNOSIS — M545 Low back pain, unspecified: Secondary | ICD-10-CM | POA: Diagnosis not present

## 2023-06-14 DIAGNOSIS — Z0000 Encounter for general adult medical examination without abnormal findings: Secondary | ICD-10-CM | POA: Diagnosis not present

## 2023-06-14 DIAGNOSIS — I10 Essential (primary) hypertension: Secondary | ICD-10-CM | POA: Diagnosis not present

## 2023-06-14 DIAGNOSIS — M810 Age-related osteoporosis without current pathological fracture: Secondary | ICD-10-CM | POA: Diagnosis not present

## 2023-06-14 DIAGNOSIS — E7800 Pure hypercholesterolemia, unspecified: Secondary | ICD-10-CM | POA: Diagnosis not present

## 2023-06-14 DIAGNOSIS — Z23 Encounter for immunization: Secondary | ICD-10-CM | POA: Diagnosis not present

## 2023-06-14 DIAGNOSIS — E569 Vitamin deficiency, unspecified: Secondary | ICD-10-CM | POA: Diagnosis not present

## 2023-10-02 DIAGNOSIS — E7800 Pure hypercholesterolemia, unspecified: Secondary | ICD-10-CM | POA: Diagnosis not present

## 2023-10-02 DIAGNOSIS — I10 Essential (primary) hypertension: Secondary | ICD-10-CM | POA: Diagnosis not present

## 2023-10-24 DIAGNOSIS — Z1231 Encounter for screening mammogram for malignant neoplasm of breast: Secondary | ICD-10-CM | POA: Diagnosis not present

## 2023-10-24 DIAGNOSIS — M8589 Other specified disorders of bone density and structure, multiple sites: Secondary | ICD-10-CM | POA: Diagnosis not present

## 2023-11-01 DIAGNOSIS — E7800 Pure hypercholesterolemia, unspecified: Secondary | ICD-10-CM | POA: Diagnosis not present

## 2023-11-01 DIAGNOSIS — I10 Essential (primary) hypertension: Secondary | ICD-10-CM | POA: Diagnosis not present

## 2023-11-30 DIAGNOSIS — Z1283 Encounter for screening for malignant neoplasm of skin: Secondary | ICD-10-CM | POA: Diagnosis not present

## 2023-11-30 DIAGNOSIS — L718 Other rosacea: Secondary | ICD-10-CM | POA: Diagnosis not present

## 2023-11-30 DIAGNOSIS — L821 Other seborrheic keratosis: Secondary | ICD-10-CM | POA: Diagnosis not present

## 2023-11-30 DIAGNOSIS — D225 Melanocytic nevi of trunk: Secondary | ICD-10-CM | POA: Diagnosis not present

## 2023-11-30 DIAGNOSIS — L905 Scar conditions and fibrosis of skin: Secondary | ICD-10-CM | POA: Diagnosis not present

## 2024-02-01 ENCOUNTER — Emergency Department (HOSPITAL_BASED_OUTPATIENT_CLINIC_OR_DEPARTMENT_OTHER): Admitting: Radiology

## 2024-02-01 ENCOUNTER — Emergency Department (HOSPITAL_BASED_OUTPATIENT_CLINIC_OR_DEPARTMENT_OTHER)

## 2024-02-01 ENCOUNTER — Other Ambulatory Visit: Payer: Self-pay

## 2024-02-01 ENCOUNTER — Encounter (HOSPITAL_BASED_OUTPATIENT_CLINIC_OR_DEPARTMENT_OTHER): Payer: Self-pay

## 2024-02-01 ENCOUNTER — Emergency Department (HOSPITAL_BASED_OUTPATIENT_CLINIC_OR_DEPARTMENT_OTHER)
Admission: EM | Admit: 2024-02-01 | Discharge: 2024-02-01 | Disposition: A | Discharge status: Home / Self Care | Source: Ambulatory Visit | Attending: Emergency Medicine | Admitting: Emergency Medicine

## 2024-02-01 DIAGNOSIS — R748 Abnormal levels of other serum enzymes: Secondary | ICD-10-CM | POA: Diagnosis not present

## 2024-02-01 DIAGNOSIS — I10 Essential (primary) hypertension: Secondary | ICD-10-CM | POA: Diagnosis not present

## 2024-02-01 DIAGNOSIS — Z79899 Other long term (current) drug therapy: Secondary | ICD-10-CM | POA: Diagnosis not present

## 2024-02-01 DIAGNOSIS — M546 Pain in thoracic spine: Secondary | ICD-10-CM | POA: Diagnosis present

## 2024-02-01 DIAGNOSIS — R1013 Epigastric pain: Secondary | ICD-10-CM | POA: Insufficient documentation

## 2024-02-01 LAB — BASIC METABOLIC PANEL WITH GFR
Anion gap: 11 (ref 5–15)
BUN: 38 mg/dL — ABNORMAL HIGH (ref 8–23)
CO2: 28 mmol/L (ref 22–32)
Calcium: 10.7 mg/dL — ABNORMAL HIGH (ref 8.9–10.3)
Chloride: 100 mmol/L (ref 98–111)
Creatinine, Ser: 0.84 mg/dL (ref 0.44–1.00)
GFR, Estimated: >60 mL/min
Glucose, Bld: 95 mg/dL (ref 70–99)
Potassium: 5.1 mmol/L (ref 3.5–5.1)
Sodium: 139 mmol/L (ref 135–145)

## 2024-02-01 LAB — HEPATIC FUNCTION PANEL
ALT: 24 U/L (ref 0–44)
AST: 25 U/L (ref 15–41)
Albumin: 4.6 g/dL (ref 3.5–5.0)
Alkaline Phosphatase: 57 U/L (ref 38–126)
Bilirubin, Direct: 0.1 mg/dL (ref 0.0–0.2)
Indirect Bilirubin: 0.2 mg/dL — ABNORMAL LOW (ref 0.3–0.9)
Total Bilirubin: 0.3 mg/dL (ref 0.0–1.2)
Total Protein: 7.1 g/dL (ref 6.5–8.1)

## 2024-02-01 LAB — CBC
HCT: 40.9 % (ref 36.0–46.0)
Hemoglobin: 13.5 g/dL (ref 12.0–15.0)
MCH: 30.1 pg (ref 26.0–34.0)
MCHC: 33 g/dL (ref 30.0–36.0)
MCV: 91.1 fL (ref 80.0–100.0)
Platelets: 227 K/uL (ref 150–400)
RBC: 4.49 MIL/uL (ref 3.87–5.11)
RDW: 12.4 % (ref 11.5–15.5)
WBC: 4.6 K/uL (ref 4.0–10.5)
nRBC: 0 % (ref 0.0–0.2)

## 2024-02-01 LAB — TROPONIN T, HIGH SENSITIVITY
Troponin T High Sensitivity: <15 ng/L (ref 0–19)
Troponin T High Sensitivity: <15 ng/L (ref 0–19)

## 2024-02-01 LAB — LIPASE, BLOOD: Lipase: 58 U/L — ABNORMAL HIGH (ref 11–51)

## 2024-02-01 MED ORDER — IOHEXOL 350 MG/ML SOLN
100.0000 mL | Freq: Once | INTRAVENOUS | Status: AC | PRN
  Administered 2024-02-01: 100 mL via INTRAVENOUS

## 2024-02-01 MED ORDER — MELOXICAM 7.5 MG PO TABS: 7.5000 mg | ORAL_TABLET | Freq: Every day | ORAL | 0 refills | Status: DC

## 2024-02-01 MED ORDER — IOHEXOL 300 MG/ML  SOLN: 100.0000 mL | Freq: Once | INTRAMUSCULAR | Status: DC | PRN

## 2024-02-01 MED ORDER — LIDOCAINE 5 % EX PTCH
1.0000 | MEDICATED_PATCH | CUTANEOUS | Status: DC
  Administered 2024-02-01: 1 via TRANSDERMAL
  Filled 2024-02-01: qty 1

## 2024-02-01 MED ORDER — LIDOCAINE 5 % EX PTCH: 1.0000 | MEDICATED_PATCH | CUTANEOUS | 0 refills | Status: DC

## 2024-02-01 NOTE — Discharge Instructions (Signed)
 You can use the Mobic and Lidoderm patches for pain.  Also start taking omeprazole once daily.  Your lipase or pancreas test was mildly elevated.  Eat a bland diet for few days and make sure you follow-up with your primary care doctor to make sure your symptoms have improved.  You have a little bit of emphysema changes on your CT scan.  Let your doctor know about this as well.  Return to the emergency room if you have any worsening symptoms.

## 2024-02-01 NOTE — ED Triage Notes (Signed)
 Pt reports L sided flank pain since Thanksgiving and chest pain today. Pt reports increased burping today as well.

## 2024-02-01 NOTE — ED Provider Notes (Signed)
 " [place] EMERGENCY DEPARTMENT AT [facility] Provider Note   CSN: [id number] Arrival date & time: 02/01/24  1752     Patient presents with: Chest Pain and Flank Pain (/)   Erin Herring is a 78 y.o. female.   Patient is a 78 year old female who presents with back and abdominal pain.  She said she mostly came in because of pain in her back.  It is in her mid back area along her bra line.  She says it starts in the middle and goes around to the sides.  She had this about a year ago and saw her PCP who thought it was musculoskeletal.  She said it went away and then started back again at Thanksgiving.  She has been doing a lot more lifting and stuff at home because her husband has been ill and she has been having a bring firewood inside and do more chores around the house.  She says worse with certain movements.  Particularly bending over.  She denies any radiation down her legs.  No numbness or weakness in her legs.  She also says she started having some pain yesterday in her epigastrium.  She says it radiates up into her chest.  She took an omeprazole and it seemed to have gone away.  She has a little bit of discomfort there today but no ongoing pain.  No other chest pain or shortness of breath.  No nausea or vomiting.       Prior to Admission medications  Medication Sig Start Date End Date Taking? Authorizing Provider  lidocaine (LIDODERM) 5 % Place 1 patch onto the skin daily. Remove & Discard patch within 12 hours or as directed by MD 02/01/24  Yes Lenor Hollering, MD  meloxicam (MOBIC) 7.5 MG tablet Take 1 tablet (7.5 mg total) by mouth daily. 02/01/24  Yes Lenor Hollering, MD  conjugated estrogens  (PREMARIN ) vaginal cream Place 1 Applicatorful vaginally daily. Use 1/2 g vaginally every night at bed time for the first 2 weeks, then use 1/2 g vaginally two or three times per week as needed to maintain symptom relief. Patient not taking: Reported on 04/07/2016 06/20/13   Amundson C  Silva, Brook E, MD  lisinopril (PRINIVIL,ZESTRIL) 5 MG tablet Patient takes 1/2 tablet daily 02/17/16   [provider]    Allergies: Darvocet [propoxyphene n-acetaminophen] and Detrol la [tolterodine tartrate er]    Review of Systems  Constitutional:  Negative for chills, diaphoresis, fatigue and fever.  HENT:  Negative for congestion, rhinorrhea and sneezing.   Eyes: Negative.   Respiratory:  Negative for cough and shortness of breath.   Cardiovascular:  Positive for chest pain. Negative for leg swelling.  Gastrointestinal:  Positive for abdominal pain. Negative for diarrhea, nausea and vomiting.  Genitourinary:  Negative for difficulty urinating, flank pain and frequency.  Musculoskeletal:  Positive for back pain. Negative for arthralgias.  Skin:  Negative for rash.  Neurological:  Negative for dizziness, speech difficulty, weakness, numbness and headaches.    Updated Vital Signs BP (!) 167/67   Pulse 64   Temp 98.2 F (36.8 C) (Oral)   Resp 18   Ht 5' 1 (1.549 m)   Wt 55.8 kg   SpO2 100%   BMI 23.24 kg/m   Physical Exam Constitutional:      Appearance: She is well-developed.  HENT:     Head: Normocephalic and atraumatic.  Eyes:     Pupils: Pupils are equal, round, and reactive to light.  Cardiovascular:     Rate and Rhythm: Normal rate and regular rhythm.     Heart sounds: Normal heart sounds.  Pulmonary:     Effort: Pulmonary effort is normal. No respiratory distress.     Breath sounds: Normal breath sounds. No wheezing or rales.  Chest:     Chest wall: No tenderness.  Abdominal:     General: Bowel sounds are normal.     Palpations: Abdomen is soft.     Tenderness: There is no abdominal tenderness. There is no guarding or rebound.     Comments: Mild discomfort in the epigastrium  Musculoskeletal:        General: Normal range of motion.     Cervical back: Normal range of motion and neck supple.     Comments: No palpable tenderness along the spine or  back, no rashes  Lymphadenopathy:     Cervical: No cervical adenopathy.  Skin:    General: Skin is warm and dry.     Findings: No rash.  Neurological:     Mental Status: She is alert and oriented to person, place, and time.     (all labs ordered are listed, but only abnormal results are displayed) Labs Reviewed  BASIC METABOLIC PANEL WITH GFR - Abnormal; Notable for the following components:      Result Value   BUN 38 (*)    Calcium 10.7 (*)    All other components within normal limits  HEPATIC FUNCTION PANEL - Abnormal; Notable for the following components:   Indirect Bilirubin 0.2 (*)    All other components within normal limits  LIPASE, BLOOD - Abnormal; Notable for the following components:   Lipase 58 (*)    All other components within normal limits  CBC  TROPONIN T, HIGH SENSITIVITY  TROPONIN T, HIGH SENSITIVITY    EKG: EKG Interpretation Date/Time:  Wednesday February 01 2024 17:59:54 EST Ventricular Rate:  66 PR Interval:  150 QRS Duration:  104 QT Interval:  400 QTC Calculation: 419 R Axis:   -45  Text Interpretation: Normal sinus rhythm Incomplete right bundle branch block Left anterior fascicular block Left ventricular hypertrophy ( R in aVL , Cornell product , Romhilt-Estes ) Cannot rule out Septal infarct , age undetermined Abnormal ECG When compared with ECG of 19-Jan-2022 19:33, Minimal criteria for Septal infarct are now Present Nonspecific T wave abnormality no longer evident in Inferior leads Confirmed by Lenor Hollering [phone]) on 02/01/2024 6:03:11 PM  Radiology: CT Angio Chest/Abd/Pel for Dissection W and/or W/WO Result Date: 02/01/2024 EXAM: CTA CHEST, ABDOMEN AND PELVIS WITHOUT AND WITH CONTRAST 02/01/2024 08:21:23 PM TECHNIQUE: CTA of the chest was performed without and with the administration of intravenous contrast. CTA of the abdomen and pelvis was performed without and with the administration of intravenous contrast. Multiplanar reformatted  images are provided for review. MIP images are provided for review. Automated exposure control, iterative reconstruction, and/or weight based adjustment of the mA/kV was utilized to reduce the radiation dose to as low as reasonably achievable. COMPARISON: None available. CLINICAL HISTORY: Acute aortic syndrome (AAS) suspected. FINDINGS: VASCULATURE: AORTA: There are atherosclerotic calcifications of the abdominal aorta. No acute finding. No abdominal aortic aneurysm. No dissection. PULMONARY ARTERIES: No pulmonary embolism with the limits of this exam. GREAT VESSELS OF AORTIC ARCH: No acute finding. No dissection. No arterial occlusion or significant stenosis. CELIAC TRUNK: No acute finding. No occlusion or significant stenosis. SUPERIOR MESENTERIC ARTERY: No acute finding. No occlusion or significant stenosis. INFERIOR MESENTERIC ARTERY: No  acute finding. No occlusion or significant stenosis. RENAL ARTERIES: No acute finding. No occlusion or significant stenosis. ILIAC ARTERIES: No acute finding. No occlusion or significant stenosis. CHEST: MEDIASTINUM: No mediastinal lymphadenopathy. The heart and pericardium demonstrate no acute abnormality. LUNGS AND PLEURA: Mild emphysema. The lungs are without acute process. No focal consolidation or pulmonary edema. No evidence of pleural effusion or pneumothorax. THORACIC BONES AND SOFT TISSUES: Incidental chest calcifications. No acute bone or soft tissue abnormality. ABDOMEN AND PELVIS: LIVER: The liver is unremarkable. GALLBLADDER AND BILE DUCTS: Gallbladder is unremarkable. No biliary ductal dilatation. SPLEEN: The spleen is unremarkable. PANCREAS: The pancreas is unremarkable. ADRENAL GLANDS: Bilateral adrenal glands demonstrate no acute abnormality. KIDNEYS, URETERS AND BLADDER: No stones in the kidneys or ureters. No hydronephrosis. No perinephric or periureteral stranding. Urinary bladder is unremarkable. GI AND BOWEL: There is a large amount of stool throughout the  colon. There is sigmoid colon diverticulosis. The appendix is not visualized. Stomach and duodenal sweep demonstrate no acute abnormality. There is no bowel obstruction. No abnormal bowel wall thickening or distension. REPRODUCTIVE: The uterus is surgically absent. PERITONEUM AND RETROPERITONEUM: No ascites or free air. LYMPH NODES: No lymphadenopathy. ABDOMINAL BONES AND SOFT TISSUES: No acute abnormality of the bones. No acute soft tissue abnormality. IMPRESSION: 1. No evidence of acute aortic syndrome. 2. Mild emphysema; pulmonary emphysema is an independent risk factor for lung cancer and consideration may be given to evaluation for a low-dose CT lung cancer screening program. 3. Large amount of stool throughout the colon and sigmoid colon diverticulosis without evidence of diverticulitis. Electronically signed by: Greig Pique MD 02/01/2024 09:34 PM EST RP Workstation: [id number]   DG Chest 2 View Result Date: 02/01/2024 EXAM: 2 VIEW(S) XRAY OF THE CHEST 02/01/2024 06:19:00 PM COMPARISON: None available. CLINICAL HISTORY: chest pain FINDINGS: LUNGS AND PLEURA: No focal pulmonary opacity. No pleural effusion. No pneumothorax. HEART AND MEDIASTINUM: Aortic atherosclerosis. No acute abnormality of the cardiac and mediastinal silhouettes. BONES AND SOFT TISSUES: No acute osseous abnormality. IMPRESSION: 1. No acute cardiopulmonary findings. 2. Aortic atherosclerosis. Electronically signed by: Dorethia Molt MD 02/01/2024 07:28 PM EST RP Workstation: [id number]     Procedures   Medications Ordered in the ED  lidocaine (LIDODERM) 5 % 1 patch (1 patch Transdermal Patch Applied 02/01/24 2215)  iohexol (OMNIPAQUE) 350 MG/ML injection 100 mL (100 mLs Intravenous Contrast Given 02/01/24 2021)                                    Medical Decision Making Amount and/or Complexity of Data Reviewed Labs: ordered. Radiology: ordered.  Risk Prescription drug management.   This patient presents to the ED  for concern of back pain, epigastric pain, this involves an extensive number of treatment options, and is a complaint that carries with it a high risk of complications and morbidity.  I considered the following differential and admission for this acute, potentially life threatening condition.  The differential diagnosis includes musculoskeletal back pain, ACS, aortic dissection, pancreatitis, cholecystitis, GERD  MDM:    Patient is a 78 year old female who presents mostly with pain across her mid back.  It is worse with certain movements.  Has been going on for about a month.   there is no radicular symptoms.  No neurologic deficits.  She does have some pain in her epigastrium as well.  EKG does not show any ischemic changes.  Troponins are negative.  Her lipase is  minimally elevated.  CT chest abdomen pelvis was performed which shows no evidence of aortic dissection.  No evidence of gallbladder disease or cholecystitis.  She does not have any urinary symptoms or concerns for kidney stones.  She was discharged home in good condition.  She was given prescription for Mobic and Lidoderm patches.  She was encouraged to have close follow-up with her PCP.  Return precautions were given.  (Labs, imaging, consults)  Labs: I Ordered, and personally interpreted labs.  The pertinent results include: Normal troponins, mildly elevated lipase, normal LFTs  Imaging Studies ordered: I ordered imaging studies including CT chest abdomen pelvis I independently visualized and interpreted imaging. I agree with the radiologist interpretation  Additional history obtained from  .  External records from outside source obtained and reviewed including ER notes  Cardiac Monitoring: The patient was not maintained on a cardiac monitor.  If on the cardiac monitor, I personally viewed and interpreted the cardiac monitored which showed an underlying rhythm of:    Reevaluation: After the interventions noted above, I reevaluated  the patient and found that they have :improved  Social Determinants of Health:    Disposition: Discharged to home  Co morbidities that complicate the patient evaluation  Past Medical History:  Diagnosis Date   Hypertension    Urinary incontinence      Medicines Meds ordered this encounter  Medications   DISCONTD: iohexol (OMNIPAQUE) 300 MG/ML solution 100 mL   iohexol (OMNIPAQUE) 350 MG/ML injection 100 mL   lidocaine (LIDODERM) 5 % 1 patch   meloxicam (MOBIC) 7.5 MG tablet    Sig: Take 1 tablet (7.5 mg total) by mouth daily.    Dispense:  30 tablet    Refill:  0   lidocaine (LIDODERM) 5 %    Sig: Place 1 patch onto the skin daily. Remove & Discard patch within 12 hours or as directed by MD    Dispense:  30 patch    Refill:  0    I have reviewed the patients home medicines and have made adjustments as needed  Problem List / ED Course: Problem List Items Addressed This Visit   None Visit Diagnoses       Acute bilateral thoracic back pain    -  Primary   Relevant Medications   meloxicam (MOBIC) 7.5 MG tablet     Elevated lipase                    Final diagnoses:  Acute bilateral thoracic back pain  Elevated lipase    ED Discharge Orders          Ordered    meloxicam (MOBIC) 7.5 MG tablet  Daily        02/01/24 2210    lidocaine (LIDODERM) 5 %  Every 24 hours        02/01/24 2210               Lenor Hollering, MD 02/01/24 2226  "

## 2024-03-02 ENCOUNTER — Other Ambulatory Visit: Payer: Self-pay | Admitting: Family Medicine

## 2024-03-02 DIAGNOSIS — I700 Atherosclerosis of aorta: Secondary | ICD-10-CM

## 2024-03-08 ENCOUNTER — Emergency Department (HOSPITAL_COMMUNITY)

## 2024-03-08 ENCOUNTER — Other Ambulatory Visit: Payer: Self-pay

## 2024-03-08 ENCOUNTER — Encounter (HOSPITAL_COMMUNITY): Payer: Self-pay

## 2024-03-08 ENCOUNTER — Inpatient Hospital Stay (HOSPITAL_COMMUNITY)
Admission: EM | Admit: 2024-03-08 | Source: Ambulatory Visit | Attending: Internal Medicine | Admitting: Internal Medicine

## 2024-03-08 ENCOUNTER — Inpatient Hospital Stay (HOSPITAL_COMMUNITY)

## 2024-03-08 DIAGNOSIS — I613 Nontraumatic intracerebral hemorrhage in brain stem: Secondary | ICD-10-CM | POA: Diagnosis not present

## 2024-03-08 DIAGNOSIS — H532 Diplopia: Secondary | ICD-10-CM

## 2024-03-08 DIAGNOSIS — I10 Essential (primary) hypertension: Secondary | ICD-10-CM

## 2024-03-08 DIAGNOSIS — R011 Cardiac murmur, unspecified: Secondary | ICD-10-CM | POA: Diagnosis present

## 2024-03-08 DIAGNOSIS — R29701 NIHSS score 1: Secondary | ICD-10-CM | POA: Diagnosis not present

## 2024-03-08 DIAGNOSIS — R030 Elevated blood-pressure reading, without diagnosis of hypertension: Secondary | ICD-10-CM

## 2024-03-08 DIAGNOSIS — F419 Anxiety disorder, unspecified: Secondary | ICD-10-CM | POA: Diagnosis present

## 2024-03-08 DIAGNOSIS — I161 Hypertensive emergency: Secondary | ICD-10-CM | POA: Diagnosis not present

## 2024-03-08 DIAGNOSIS — I619 Nontraumatic intracerebral hemorrhage, unspecified: Secondary | ICD-10-CM | POA: Diagnosis not present

## 2024-03-08 LAB — CBC
HCT: 39.9 % (ref 36.0–46.0)
Hemoglobin: 13.1 g/dL (ref 12.0–15.0)
MCH: 30.3 pg (ref 26.0–34.0)
MCHC: 32.8 g/dL (ref 30.0–36.0)
MCV: 92.4 fL (ref 80.0–100.0)
Platelets: 229 10*3/uL (ref 150–400)
RBC: 4.32 MIL/uL (ref 3.87–5.11)
RDW: 12 % (ref 11.5–15.5)
WBC: 4.6 10*3/uL (ref 4.0–10.5)
nRBC: 0 % (ref 0.0–0.2)

## 2024-03-08 LAB — DIFFERENTIAL
Abs Immature Granulocytes: 0.01 10*3/uL (ref 0.00–0.07)
Basophils Absolute: 0 10*3/uL (ref 0.0–0.1)
Basophils Relative: 1 %
Eosinophils Absolute: 0 10*3/uL (ref 0.0–0.5)
Eosinophils Relative: 1 %
Immature Granulocytes: 0 %
Lymphocytes Relative: 28 %
Lymphs Abs: 1.3 10*3/uL (ref 0.7–4.0)
Monocytes Absolute: 0.2 10*3/uL (ref 0.1–1.0)
Monocytes Relative: 5 %
Neutro Abs: 3 10*3/uL (ref 1.7–7.7)
Neutrophils Relative %: 65 %

## 2024-03-08 LAB — URINE DRUG SCREEN
Amphetamines: NEGATIVE
Barbiturates: NEGATIVE
Benzodiazepines: NEGATIVE
Cocaine: NEGATIVE
Fentanyl: NEGATIVE
Methadone Scn, Ur: NEGATIVE
Opiates: NEGATIVE
Tetrahydrocannabinol: NEGATIVE

## 2024-03-08 LAB — HEMOGLOBIN A1C
Hgb A1c MFr Bld: 5.5 % (ref 4.8–5.6)
Mean Plasma Glucose: 111.15 mg/dL

## 2024-03-08 LAB — COMPREHENSIVE METABOLIC PANEL WITH GFR
ALT: 38 U/L (ref 0–44)
AST: 31 U/L (ref 15–41)
Albumin: 4.7 g/dL (ref 3.5–5.0)
Alkaline Phosphatase: 56 U/L (ref 38–126)
Anion gap: 12 (ref 5–15)
BUN: 19 mg/dL (ref 8–23)
CO2: 24 mmol/L (ref 22–32)
Calcium: 10.1 mg/dL (ref 8.9–10.3)
Chloride: 105 mmol/L (ref 98–111)
Creatinine, Ser: 0.76 mg/dL (ref 0.44–1.00)
GFR, Estimated: >60 mL/min
Glucose, Bld: 101 mg/dL — ABNORMAL HIGH (ref 70–99)
Potassium: 4.3 mmol/L (ref 3.5–5.1)
Sodium: 141 mmol/L (ref 135–145)
Total Bilirubin: 0.3 mg/dL (ref 0.0–1.2)
Total Protein: 7.3 g/dL (ref 6.5–8.1)

## 2024-03-08 LAB — ETHANOL: Alcohol, Ethyl (B): <15 mg/dL

## 2024-03-08 LAB — PROTIME-INR
INR: 0.9 (ref 0.8–1.2)
Prothrombin Time: 12.7 s (ref 11.4–15.2)

## 2024-03-08 LAB — CBG MONITORING, ED: Glucose-Capillary: 97 mg/dL (ref 70–99)

## 2024-03-08 LAB — APTT: aPTT: 29 s (ref 24–36)

## 2024-03-08 MED ORDER — IOHEXOL 350 MG/ML SOLN
75.0000 mL | Freq: Once | INTRAVENOUS | Status: AC | PRN
  Administered 2024-03-08: 75 mL via INTRAVENOUS

## 2024-03-08 MED ORDER — LORAZEPAM 1 MG PO TABS: 0.5000 mg | ORAL_TABLET | Freq: Four times a day (QID) | ORAL | Status: AC | PRN

## 2024-03-08 MED ORDER — HYDROXYZINE HCL 10 MG PO TABS
10.0000 mg | ORAL_TABLET | Freq: Three times a day (TID) | ORAL | Status: AC | PRN
  Administered 2024-03-09: 10 mg via ORAL
  Filled 2024-03-08 (×2): qty 1

## 2024-03-08 MED ORDER — HYDROXYZINE HCL 10 MG PO TABS
10.0000 mg | ORAL_TABLET | Freq: Once | ORAL | Status: AC
  Administered 2024-03-08: 10 mg via ORAL

## 2024-03-08 MED ORDER — LISINOPRIL 2.5 MG PO TABS
2.5000 mg | ORAL_TABLET | Freq: Every day | ORAL | Status: AC
  Administered 2024-03-09: 2.5 mg via ORAL
  Filled 2024-03-08 (×2): qty 1

## 2024-03-08 MED ORDER — HYDRALAZINE HCL 20 MG/ML IJ SOLN: 10.0000 mg | INTRAMUSCULAR | Status: DC | PRN

## 2024-03-08 MED ORDER — LISINOPRIL 2.5 MG PO TABS: 2.5000 mg | ORAL_TABLET | Freq: Every day | ORAL | Status: DC

## 2024-03-08 MED ORDER — LABETALOL HCL 5 MG/ML IV SOLN
10.0000 mg | Freq: Once | INTRAVENOUS | Status: AC
  Administered 2024-03-08: 10 mg via INTRAVENOUS
  Filled 2024-03-08: qty 4

## 2024-03-08 MED ORDER — STROKE: EARLY STAGES OF RECOVERY BOOK
Freq: Once | Status: AC
  Filled 2024-03-08: qty 1

## 2024-03-08 MED ORDER — SENNOSIDES-DOCUSATE SODIUM 8.6-50 MG PO TABS: 1.0000 | ORAL_TABLET | Freq: Every evening | ORAL | Status: AC | PRN

## 2024-03-08 MED ORDER — SODIUM CHLORIDE 0.9 % IV SOLN: INTRAVENOUS | Status: AC

## 2024-03-08 MED ORDER — HYDRALAZINE HCL 20 MG/ML IJ SOLN
10.0000 mg | INTRAMUSCULAR | Status: AC | PRN
  Administered 2024-03-08: 10 mg via INTRAVENOUS
  Filled 2024-03-08: qty 1

## 2024-03-08 MED ORDER — HYDRALAZINE HCL 20 MG/ML IJ SOLN: 10.0000 mg | Freq: Once | INTRAMUSCULAR | Status: AC

## 2024-03-08 NOTE — ED Notes (Signed)
 CCMD contacted.

## 2024-03-08 NOTE — H&P (Addendum)
 " History and Physical    Patient: Erin Herring FMW:[id number] DOB: [birth year] DOA: 03/08/2024 DOS: the patient was seen and examined on 03/08/2024 PCP: Dayna Motto, DO  Patient coming from: Home with results  Chief Complaint:  Chief Complaint  Patient presents with   Diplopia   HPI: Erin Herring is a 79 y.o. female with medical history significant of hypertension and urinary incontinence presents with double vision and left-sided numbness  She has been experiencing double vision for four days. The double vision is described as 'terrible' and is particularly noticeable when meeting cars, seeing two instead of one. It resolves when one eye is covered. She delayed seeking medical attention due to being preoccupied with her husband's health issues.  Four days ago, her husband had a medical emergency where his heart rate was in the 30s and ended up being sent to [facility] where he received a pacemaker, during which her blood pressure increased significantly to 210/105 mmHg. She also experienced numbness on her entire left side, making walking difficult. She describes herself as normally very active and able to walk without assistance.  She has been taking meloxicam  since January 1st for severe, deep pains around her rib cage and sides, which have been present since before Thanksgiving.  She discontinued meloxicam  last Friday or Saturday.  No heart palpitations or swelling have been experienced. She expresses significant stress related to caring for her husband and son, who lives with her.  Her husband, who is 28 years old, experienced a medical emergency four days ago, leading to a pacemaker placement. She has been involved in coordinating his care and rehabilitation, which has contributed to her stress.  In the emergency department patient was noted to be afebrile with blood pressures elevated up to 193/77, and all other vital signs maintained.  Labs were unremarkable.  Patient was out of the  window for any kind of intervention.  MRI of the brain noted small hemorrhage within the posterior inferior central pons with mild surrounding edema.  Neurology had been consulted and recommended admission and completion of stroke workup.  Patient had been given labetalol  10 mg IV.  Review of Systems: As mentioned in the history of present illness. All other systems reviewed and are negative. Past Medical History:  Diagnosis Date   Hypertension    Urinary incontinence    Past Surgical History:  Procedure Laterality Date   ABDOMINAL HYSTERECTOMY  04/2010   TAH/BSO/A & P repair--Dr. Nikki   BREAST SURGERY  08/2012   Breast lift   CERVICAL DISC SURGERY     Dr. Carles   COSMETIC SURGERY  10/2012   eye lift   DILATION AND CURETTAGE OF UTERUS     TUBAL LIGATION     tubal reanastomosis     tummy tuck  08/2012   TVT/Sacrocolpopexy  04/2010   Dr. Nikki   Social History:  reports that she has never smoked. She has never used smokeless tobacco. She reports current alcohol use. She reports that she does not use drugs.  Allergies[1]  Family History  Problem Relation Age of Onset   Diabetes Mother    Hypertension Mother    Seizures Mother        epilepsy   Stroke Mother        TIA's   Thyroid  disease Mother        hypothyroid   Colon cancer Father    Diabetes Father    Hypertension Father    Diabetes Sister  Hypertension Sister     Prior to Admission medications  Medication Sig Start Date End Date Taking? Authorizing Provider  conjugated estrogens  (PREMARIN ) vaginal cream Place 1 Applicatorful vaginally daily. Use 1/2 g vaginally every night at bed time for the first 2 weeks, then use 1/2 g vaginally two or three times per week as needed to maintain symptom relief. Patient not taking: Reported on 04/07/2016 06/20/13   Cathlyn JAYSON Nikki Bobie FORBES, MD  lidocaine  (LIDODERM ) 5 % Place 1 patch onto the skin daily. Remove & Discard patch within 12 hours or as directed by MD 02/01/24   Lenor Hollering, MD  lisinopril  (PRINIVIL ,ZESTRIL ) 5 MG tablet Patient takes 1/2 tablet daily 02/17/16   [provider]  meloxicam  (MOBIC ) 7.5 MG tablet Take 1 tablet (7.5 mg total) by mouth daily. 02/01/24   Lenor Hollering, MD    Physical Exam: Vitals:   03/08/24 1231 03/08/24 1652 03/08/24 1659 03/08/24 1700  BP:  (!) 189/79  (!) 165/85  Pulse:  64  62  Resp:  18  12  Temp:   98 F (36.7 C)   TempSrc:   Oral   SpO2:  100%  100%  Weight: 56.7 kg     Height: 5' 1 (1.549 m)      Constitutional: Elderly female  Eyes: PERRL, lids and conjunctivae normal.  Double vision present. ENMT: Mucous membranes are moist. Posterior pharynx clear of any exudate or lesions.Normal dentition.  Neck: normal, supple  Respiratory: clear to auscultation bilaterally, no wheezing, no crackles.No accessory muscle use.  Cardiovascular: Regular rate and rhythm with 2/6 systolic murmur present. Abdomen: no tenderness, no masses palpated.  Bowel sounds positive.  Musculoskeletal: no clubbing / cyanosis. No joint deformity upper and lower extremities. Good ROM, no contractures. Normal muscle tone.  Skin: no rashes, lesions, ulcers. No induration Neurologic: CN 2-12 grossly intact. Strength 5/5 in all 4.  Patient reports still having abnormal sensation in the left Psychiatric: Normal judgment and insight. Alert and oriented x 3. Normal mood.   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  Patient presented with complaints of double vision and paresthesias involving the left side of her body over the last 4 days after blood pressures were elevated into the 200s during husband medical emergency.  MRI of the head revealed concerns for a pontine hemorrhage with surrounding edema.  Unclear if patient recently starting meloxicam  as the cause for her symptoms. - Admit to a progressive bed - Stroke order set  utilized - Neurochecks - Check hemoglobin A1c and lipid panel - Check CT angiogram of the head and neck - Check echocardiogram - PT/OT/speech to evaluate and treat - Recheck CT scan of the head tomorrow morning to ensure stability of bleed - No aspirin or Plavix due to acute bleed.    - Appreciate neurology consultative services we will follow-up for any further recommendations  Hypertensive emergency Blood pressures noted to be elevated up to 193/77.   - Continue lisinopril , but may need to increase dose - Hydralazine  IV as needed for goal systolic blood pressure less than 160.  Heart murmur Patient has a blowing systolic murmur present - Follow-up echocardiogram for further characterization  Anxiety Patient reports multiple social stressors and anxiety with husband currently being in the hospital - Hydroxyzine /Ativan  as needed to treat mild-moderate versus severe anxiety respectively  DVT prophylaxis: SCDs Advance Care Planning:   Code Status:  Full Code   Consults: Neurology  Family Communication: Declined need to update daughter over the phone  Severity of Illness: The appropriate patient status for this patient is INPATIENT. Inpatient status is judged to be reasonable and necessary in order to provide the required intensity of service to ensure the patient's safety. The patient's presenting symptoms, physical exam findings, and initial radiographic and laboratory data in the context of their chronic comorbidities is felt to place them at high risk for further clinical deterioration. Furthermore, it is not anticipated that the patient will be medically stable for discharge from the hospital within 2 midnights of admission.   * I certify that at the point of admission it is my clinical judgment that the patient will require inpatient hospital care spanning beyond 2 midnights from the point of admission due to high intensity of service, high risk for further deterioration and high  frequency of surveillance required.*  Author: Maximino DELENA Sharps, MD 03/08/2024 5:20 PM  For on call review [email or web address].      [1]  Allergies Allergen Reactions   Darvocet [Propoxyphene N-Acetaminophen ] Other (See Comments)    Hallucinations--(allergy to Darvocet N-100_   Detrol La [Tolterodine Tartrate Er]     Blurred vision.   "

## 2024-03-08 NOTE — ED Notes (Signed)
 CCMD called and verified patient on cardiac telemetry

## 2024-03-08 NOTE — ED Triage Notes (Signed)
 BIB family from optometrist for concern for stroke and double vision. Seen by Dr. Ladora, OD in [place]. Endorses double vision, and L side numbness and tingling in face and hand and foot. Onset 2/1, 4 days ago. Pt alert, NAD, calm, interactive, resps e/u, speaking in clear complete sentences. Steady gait.

## 2024-03-08 NOTE — Consult Note (Signed)
 NEUROLOGY CONSULT NOTE   Date of service: March 08, 2024 Patient Name: Erin Herring MRN:  [id number] DOB:  11-30-1945 Chief Complaint: double vision Requesting Provider: Bernard Drivers, MD  History of Present Illness  Erin Herring is a 79 y.o. female with hx of HTN who presented to ED today from optometrist office d/t double vision. When seen by EDP, patient endorsed double vision, L sided numbness and tingling in face, hand and foot with a symptom onset of 4 days ago. MRI showed small pontine infarct. Neurology consulted for evaluation.   On neurology exam, patient continues to endorse double vision, tingling that is intermittently present on left fingertips and bottom of foot only. Sensation is intact, just tingling.No confusion, aphasia, dysarthria or focal weakness.   Confirms that these symptoms started Sunday, when she was also unable to walk down the stairs due to imbalance, had to scoot down the stairs one at a time. Her BP was measured to be over 200 at this time, EMS was there picking her husband up and they checked her BP while there. Family states that EMS told her she was not having a stroke and did not need to go in to the hospital.   Son at bedside. Reviewed MRI findings and workup recommended. Patients husband is in the hospital in [place] awaiting SNF acceptance, as she can not care for him at home any more (prior to her coming in the hospital). She does not take Aspirin or Plavix at home, endorses compliance with her BP medications. Also confirms increased stress recently due to husbands health, and worry that she will have another panic attack (has had multiple in her life during stressful life events).   LKW: 4 days ago ICH score: 1  NIHSS components Score: Comment  1a Level of Conscious 0[x] 1[] 2[] 3[]     1b LOC Questions 0[x] 1[] 2[]      1c LOC Commands 0[x] 1[] 2[]      2 Best Gaze 0[x] 1[] 2[]      3 Visual 0[] 1[x] 2[] 3[]     4 Facial Palsy 0[x] 1[] 2[]  3[]     5a Motor Arm - left 0[x] 1[] 2[] 3[] 4[] UN[]   5b Motor Arm - Right 0[x] 1[] 2[] 3[] 4[] UN[]   6a Motor Leg - Left 0[x] 1[] 2[] 3[] 4[] UN[]   6b Motor Leg - Right 0[x] 1[] 2[] 3[] 4[] UN[]   7 Limb Ataxia 0[x] 1[] 2[] UN[]     8 Sensory 0[x] 1[] 2[] UN[]     9 Best Language 0[x] 1[] 2[] 3[]     10 Dysarthria 0[x] 1[] 2[] UN[]     11  Extinct. and Inattention 0[x]  1[]  2[]       TOTAL:   1      ROS  Comprehensive ROS performed and pertinent positives documented in HPI   Past History   Past Medical History:  Diagnosis Date   Hypertension    Urinary incontinence     Past Surgical History:  Procedure Laterality Date   ABDOMINAL HYSTERECTOMY  04/2010   TAH/BSO/A & P repair--Dr. Nikki   BREAST SURGERY  08/2012   Breast lift   CERVICAL DISC SURGERY     Dr. Carles   COSMETIC SURGERY  10/2012   eye lift   DILATION AND CURETTAGE OF UTERUS     TUBAL LIGATION     tubal reanastomosis     tummy tuck  08/2012   TVT/Sacrocolpopexy  04/2010   Dr. Nikki    Family History: Family History  Problem Relation Age of Onset   Diabetes Mother    Hypertension Mother    Seizures Mother        epilepsy   Stroke Mother        TIA's   Thyroid  disease Mother        hypothyroid   Colon cancer Father    Diabetes Father    Hypertension Father    Diabetes Sister    Hypertension Sister     Social History  reports that she has never smoked. She has never used smokeless tobacco. She reports current alcohol use. She reports that she does not use drugs.  Allergies[1]  Medications  Current Medications[2]  Vitals   Vitals:   2024/03/15 1205 Mar 15, 2024 1207 03-15-24 1231  BP: (!) 184/80 (!) 193/77   Pulse: 68 69   Resp: 18    Temp: 98.6 F (37 C)    TempSrc: Oral    SpO2: 100% 100%   Weight:   56.7 kg  Height:   5' 1 (1.549 m)    Body mass index is 23.62 kg/m.   Physical Exam   Constitutional: Appears well-developed and well-nourished.  Psych: Anxious.   Neurologic  Examination   Neuro: Mental Status: Patient is awake, alert, oriented to person, place, month, year, and situation. Patient is able to give a clear and coherent history. No signs of aphasia or neglect Cranial Nerves: II: Visual Fields are full. Pupils are equal, round, and reactive to light.   III,IV, VI: EOMI without ptosis. Horizontal diplopia noted by patient, close and far. V: Facial sensation is symmetric to light touch VII: Facial movement is symmetric.  VIII: hearing is intact to voice X: Uvula elevates symmetrically XI: Shoulder shrug is symmetric. XII: tongue is midline without atrophy or fasciculations.  Motor: Tone is normal. Bulk is normal. 5/5 strength was present in all four extremities.  Sensory: Sensation is symmetric to light touch in the arms and legs. Tingling noted to left fingertips and bottom of left foot, not constant.  Cerebellar: She has difficulty with finger-nose-finger on the left  Labs/Imaging/Neurodiagnostic studies   CBC:  Recent Labs  Lab 03/15/24 1248  WBC 4.6  NEUTROABS 3.0  HGB 13.1  HCT 39.9  MCV 92.4  PLT 229   Basic Metabolic Panel:  Lab Results  Component Value Date   NA 141 2024-03-15   K 4.3 03/15/24   CO2 24 15-Mar-2024   GLUCOSE 101 (H) March 15, 2024   BUN 19 2024/03/15   CREATININE 0.76 March 15, 2024   CALCIUM 10.1 03-15-24   GFRNONAA >60 Mar 15, 2024   GFRAA  04/08/2010    >60        The eGFR has been calculated using the MDRD equation. This calculation has not been validated in all clinical situations. eGFR's persistently <60 mL/min signify possible Chronic Kidney Disease.   Lipid Panel:  Lab Results  Component Value Date   [facility]  12/23/2009    70        Total Cholesterol/HDL:CHD Risk Coronary Heart Disease Risk Table                     Men   Women  1/2 Average Risk   3.4   3.3  Average Risk       5.0   4.4  2 X Average Risk   9.6   7.1  3 X Average  Risk  23.4   11.0        Use the calculated Patient  Ratio above and the CHD Risk Table to determine the patient's CHD Risk.        ATP III CLASSIFICATION (LDL):  <100     mg/dL   Optimal  899-870  mg/dL   Near or Above                    Optimal  130-159  mg/dL   Borderline  839-810  mg/dL   High  >809     mg/dL   Very High   YhaJ8r: No results found for: HGBA1C Urine Drug Screen:     Component Value Date/Time   LABOPIA NEGATIVE 03/08/2024 1248   COCAINSCRNUR NEGATIVE 03/08/2024 1248   LABBENZ NEGATIVE 03/08/2024 1248   AMPHETMU NEGATIVE 03/08/2024 1248   THCU NEGATIVE 03/08/2024 1248   LABBARB NEGATIVE 03/08/2024 1248    Alcohol Level     Component Value Date/Time   ETH <15 03/08/2024 1248   INR  Lab Results  Component Value Date   INR 0.9 03/08/2024   APTT  Lab Results  Component Value Date   APTT 29 03/08/2024   AED levels: No results found for: PHENYTOIN, ZONISAMIDE, LAMOTRIGINE, LEVETIRACETA  CT angio Head and Neck with contrast(Personally reviewed): ordered  MRI Brain(Personally reviewed): Small hemorrhage within the posterior-inferior central pons with mild surrounding edema.   ASSESSMENT   Erin Herring is a 79 y.o. female hx of HTN who presented to ED today, endorsing double vision, L sided numbness and tingling in face, hand and foot with a symptom onset of 4 days ago. MRI showed small pontine infarct.  On exam, patient continues to endorse double vision, tingling that is intermittently present on left fingertips and bottom of foot only. Sensation is intact, just tingling.No confusion, aphasia, dysarthria or focal weakness.   Small pontine hemorrhage likely secondary to hypertensive emergency.   RECOMMENDATIONS  - Frequent Neuro checks per stroke unit protocol - Vascular imaging - CT Angio - TTE - Lipid panel - Statin - will be started if LDL>70 or otherwise medically indicated - A1C - UA - Antithrombotic - HOLD due to hemorrhage - DVT ppx - SCDs - SBP goal - ideally <160, but not  more than < 180  Ok to add home meds and increase as needed to avoid her going on a medication drip - Telemetry monitoring for arrhythmia - 72h - Swallow screen - will be performed prior to PO intake - Stroke education - will be given - PT/OT/SLP  - Dispo: Admit for stroke workup  ______________________________________________________________________  Signed, Rocky JAYSON Likes, NP [facility]   I have seen the patient and reviewed the above note. she has 4 days of diplopia and left-sided discoordination as well as dizziness.  She has a small hemorrhage on her CT and I suspect that this likely is a hypertensive hemorrhage, though she does report previous good control of her hypertension.  Hemorrhagic conversion of an ischemic stroke would be another possibility but with no change in her symptoms I think this is less likely.  She will need to be admitted with therapy evaluations and I would also check lipids, A1c as well.  Stroke team to follow.  Aisha Seals, MD [facility]   If 7pm- 7am, please page neurology on call as listed in AMION.      [1]  Allergies Allergen Reactions   Darvocet [Propoxyphene N-Acetaminophen ] Other (See  Comments)    Hallucinations--(allergy to Darvocet N-100_   Detrol La [Tolterodine Tartrate Er]     Blurred vision.  [2]  Current Facility-Administered Medications:    labetalol  (NORMODYNE ) injection 10 mg, 10 mg, Intravenous, Once, Bernard Drivers, MD  Current Outpatient Medications:    conjugated estrogens  (PREMARIN ) vaginal cream, Place 1 Applicatorful vaginally daily. Use 1/2 g vaginally every night at bed time for the first 2 weeks, then use 1/2 g vaginally two or three times per week as needed to maintain symptom relief. (Patient not taking: Reported on 04/07/2016), Disp: 60 g, Rfl: 0   lidocaine  (LIDODERM ) 5 %, Place 1 patch onto the skin daily. Remove & Discard patch within 12 hours or as directed by MD, Disp: 30 patch, Rfl:  0   lisinopril  (PRINIVIL ,ZESTRIL ) 5 MG tablet, Patient takes 1/2 tablet daily, Disp: , Rfl:    meloxicam  (MOBIC ) 7.5 MG tablet, Take 1 tablet (7.5 mg total) by mouth daily., Disp: 30 tablet, Rfl: 0

## 2024-03-08 NOTE — ED Provider Triage Note (Signed)
 Emergency Medicine Provider Triage Evaluation Note  Erin Herring , a 79 y.o. female  was evaluated in triage.  Pt complains of double vision, left-sided weakness.  Onset 4 days ago, after an episode in which she was particularly anxious due to her husband requiring 911 transport.  Today she went for evaluation of her double vision to her ophthalmologist and was sent here with concern for weakness, double vision.  No pain, no syncope, no fall.  She is here with her son who assists with the history.  Review of Systems  Positive: Double vision Negative: Pain  Physical Exam  BP (!) 193/77   Pulse 69   Temp 98.6 F (37 C) (Oral)   Resp 18   Ht 1.549 m (5' 1)   Wt 56.7 kg   SpO2 100%   BMI 23.62 kg/m  Gen:   Awake, no distress elderly female speaking clearly Resp:  Normal effort no increased work of breathing MSK:   Moves extremities without difficulty no obvious abnormality Other:  Neuro: Face symmetric, speech clear, will require additional evaluation  Medical Decision Making  Medically screening exam initiated at 12:43 PM.  Appropriate orders placed.  Erin Herring was informed that the remainder of the evaluation will be completed by another provider, this initial triage assessment does not replace that evaluation, and the importance of remaining in the ED until their evaluation is complete.   Erin Charleston, MD 03/08/24 [phone]

## 2024-03-08 NOTE — ED Provider Notes (Addendum)
 " [place] [facility] DEPARTMENT AT [facility] Provider Note   CSN: [id number] Arrival date & time: 03/08/24  1200     Patient presents with: Diplopia   Erin Herring is a 79 y.o. female.   Pt with hx htn, c/o double vision and left face/hand/foot numbness/tingling in past four days. States also noted that her gait/balanced seemed a bit off.  Overall symptoms persistent/constant since onset however pt notes they have improved since that first day.Saw optometrist today and was referred to ED.  No acute or abrupt or new symptoms today. No severe headache. No vomiting. No trauma/fall. No syncope. No anticoagulant use. (Pt indicates when symptoms started her spouse was having an acute medical crisis, EMS was called, and pt was very worried and anxious and states her bp was very high then, 210/, and her symptoms began acutely then)  The history is provided by the patient and medical records.       Prior to Admission medications  Medication Sig Start Date End Date Taking? Authorizing Provider  conjugated estrogens  (PREMARIN ) vaginal cream Place 1 Applicatorful vaginally daily. Use 1/2 g vaginally every night at bed time for the first 2 weeks, then use 1/2 g vaginally two or three times per week as needed to maintain symptom relief. Patient not taking: Reported on 04/07/2016 06/20/13   Cathlyn JAYSON Nikki Bobie FORBES, MD  lidocaine  (LIDODERM ) 5 % Place 1 patch onto the skin daily. Remove & Discard patch within 12 hours or as directed by MD 02/01/24   Lenor Hollering, MD  lisinopril  (PRINIVIL ,ZESTRIL ) 5 MG tablet Patient takes 1/2 tablet daily 02/17/16   [provider]  meloxicam  (MOBIC ) 7.5 MG tablet Take 1 tablet (7.5 mg total) by mouth daily. 02/01/24   Lenor Hollering, MD    Allergies: Darvocet [propoxyphene n-acetaminophen ] and Detrol la [tolterodine tartrate er]    Review of Systems  Constitutional:  Negative for fever.  Eyes:  Positive for visual disturbance.  Respiratory:   Negative for shortness of breath.   Cardiovascular:  Negative for chest pain.  Gastrointestinal:  Negative for vomiting.  Genitourinary:  Negative for flank pain.  Musculoskeletal:  Negative for back pain and neck pain.  Neurological:  Positive for numbness. Negative for headaches.    Updated Vital Signs BP (!) 193/77   Pulse 69   Temp 98.6 F (37 C) (Oral)   Resp 18   Ht 1.549 m (5' 1)   Wt 56.7 kg   SpO2 100%   BMI 23.62 kg/m   Physical Exam Vitals and nursing note reviewed.  Constitutional:      Appearance: Normal appearance. She is well-developed.  HENT:     Head: Atraumatic.     Nose: Nose normal.     Mouth/Throat:     Mouth: Mucous membranes are moist.  Eyes:     General: No scleral icterus.    Conjunctiva/sclera: Conjunctivae normal.     Pupils: Pupils are equal, round, and reactive to light.  Neck:     Trachea: No tracheal deviation.  Cardiovascular:     Rate and Rhythm: Normal rate and regular rhythm.     Pulses: Normal pulses.     Heart sounds: Normal heart sounds. No murmur heard.    No friction rub. No gallop.  Pulmonary:     Effort: Pulmonary effort is normal. No respiratory distress.     Breath sounds: Normal breath sounds.  Abdominal:     General: There is no distension.  Palpations: Abdomen is soft.     Tenderness: There is no abdominal tenderness.  Musculoskeletal:        General: No swelling.     Cervical back: Normal range of motion and neck supple. No rigidity. No muscular tenderness.  Skin:    General: Skin is warm and dry.     Findings: No rash.  Neurological:     Mental Status: She is alert.     Comments: Alert, speech normal. ?slight left facial weakness and left hand weakness. Paresthesias left face, hand and foot.   Psychiatric:        Mood and Affect: Mood normal.     (all labs ordered are listed, but only abnormal results are displayed) Results for orders placed or performed during the hospital encounter of 03/08/24   Protime-INR   Collection Time: 03/08/24 12:48 PM  Result Value Ref Range   Prothrombin Time 12.7 11.4 - 15.2 seconds   INR 0.9 0.8 - 1.2  APTT   Collection Time: 03/08/24 12:48 PM  Result Value Ref Range   aPTT 29 24 - 36 seconds  CBC   Collection Time: 03/08/24 12:48 PM  Result Value Ref Range   WBC 4.6 4.0 - 10.5 K/uL   RBC 4.32 3.87 - 5.11 MIL/uL   Hemoglobin 13.1 12.0 - 15.0 g/dL   HCT 60.0 63.9 - 53.9 %   MCV 92.4 80.0 - 100.0 fL   MCH 30.3 26.0 - 34.0 pg   MCHC 32.8 30.0 - 36.0 g/dL   RDW 87.9 88.4 - 84.4 %   Platelets 229 150 - 400 K/uL   nRBC 0.0 0.0 - 0.2 %  Differential   Collection Time: 03/08/24 12:48 PM  Result Value Ref Range   Neutrophils Relative % 65 %   Neutro Abs 3.0 1.7 - 7.7 K/uL   Lymphocytes Relative 28 %   Lymphs Abs 1.3 0.7 - 4.0 K/uL   Monocytes Relative 5 %   Monocytes Absolute 0.2 0.1 - 1.0 K/uL   Eosinophils Relative 1 %   Eosinophils Absolute 0.0 0.0 - 0.5 K/uL   Basophils Relative 1 %   Basophils Absolute 0.0 0.0 - 0.1 K/uL   Immature Granulocytes 0 %   Abs Immature Granulocytes 0.01 0.00 - 0.07 K/uL  Comprehensive metabolic panel   Collection Time: 03/08/24 12:48 PM  Result Value Ref Range   Sodium 141 135 - 145 mmol/L   Potassium 4.3 3.5 - 5.1 mmol/L   Chloride 105 98 - 111 mmol/L   CO2 24 22 - 32 mmol/L   Glucose, Bld 101 (H) 70 - 99 mg/dL   BUN 19 8 - 23 mg/dL   Creatinine, Ser 9.23 0.44 - 1.00 mg/dL   Calcium 89.8 8.9 - 89.6 mg/dL   Total Protein 7.3 6.5 - 8.1 g/dL   Albumin 4.7 3.5 - 5.0 g/dL   AST 31 15 - 41 U/L   ALT 38 0 - 44 U/L   Alkaline Phosphatase 56 38 - 126 U/L   Total Bilirubin 0.3 0.0 - 1.2 mg/dL   GFR, Estimated >39 >39 mL/min   Anion gap 12 5 - 15  Ethanol   Collection Time: 03/08/24 12:48 PM  Result Value Ref Range   Alcohol, Ethyl (B) <15 <15 mg/dL  Urine rapid drug screen (hosp performed)   Collection Time: 03/08/24 12:48 PM  Result Value Ref Range   Opiates NEGATIVE NEGATIVE   Cocaine NEGATIVE  NEGATIVE   Benzodiazepines NEGATIVE NEGATIVE   Amphetamines NEGATIVE  NEGATIVE   Tetrahydrocannabinol NEGATIVE NEGATIVE   Barbiturates NEGATIVE NEGATIVE   Methadone Scn, Ur NEGATIVE NEGATIVE   Fentanyl NEGATIVE NEGATIVE  CBG monitoring, ED   Collection Time: 03/08/24 12:52 PM  Result Value Ref Range   Glucose-Capillary 97 70 - 99 mg/dL   MR BRAIN WO CONTRAST Result Date: 03/08/2024 EXAM: MRI BRAIN WITHOUT CONTRAST 03/08/2024 04:04:34 PM TECHNIQUE: Multiplanar multisequence MRI of the head/brain was performed without the administration of intravenous contrast. COMPARISON: None available. CLINICAL HISTORY: Neuro deficit, acute, stroke suspected; Left-sided weakness, double vision. Acute neurologic deficit; stroke suspected. Left-sided weakness, double vision. FINDINGS: BRAIN AND VENTRICLES: No acute infarct. No mass effect. No midline shift. No hydrocephalus. The sella is unremarkable. Normal flow voids. There is mild for age subcortical and periventricular T2 and FLAIR signal hyperintensity likely reflecting sequelae of chronic microvascular ischemia. There is a 0.8 x 1.4 cm region of T1 hyperintensity, central T2 hypointensity, and associated susceptibility artifact most consistent with hemorrhage in the posterior and inferior central pons. There is mild surrounding T2/FLAIR hyperintensity which likely represents edema. ORBITS: No significant abnormality. SINUSES AND MASTOIDS: No significant abnormality. BONES AND SOFT TISSUES: Normal marrow signal. No soft tissue abnormality. IMPRESSION: 1. Small hemorrhage within the posterior-inferior central pons with mild surrounding edema. This finding was discussed with Dr. Hend Mccarrell by phone at 4:20 PM. Electronically signed by: Prentice Spade MD 03/08/2024 04:23 PM EST RP Workstation: [id number]     EKG: EKG Interpretation Date/Time:  Thursday March 08 2024 12:55:28 EST Ventricular Rate:  69 PR Interval:  148 QRS Duration:  110 QT Interval:  414 QTC  Calculation: 443 R Axis:   -44  Text Interpretation: Sinus rhythm Non-specific intra-ventricular conduction delay Left axis deviation Non-specific ST-t changes Confirmed by Bernard Drivers ([id number]) on 03/08/2024 4:22:01 PM  Radiology: MR BRAIN WO CONTRAST Result Date: 03/08/2024 EXAM: MRI BRAIN WITHOUT CONTRAST 03/08/2024 04:04:34 PM TECHNIQUE: Multiplanar multisequence MRI of the head/brain was performed without the administration of intravenous contrast. COMPARISON: None available. CLINICAL HISTORY: Neuro deficit, acute, stroke suspected; Left-sided weakness, double vision. Acute neurologic deficit; stroke suspected. Left-sided weakness, double vision. FINDINGS: BRAIN AND VENTRICLES: No acute infarct. No mass effect. No midline shift. No hydrocephalus. The sella is unremarkable. Normal flow voids. There is mild for age subcortical and periventricular T2 and FLAIR signal hyperintensity likely reflecting sequelae of chronic microvascular ischemia. There is a 0.8 x 1.4 cm region of T1 hyperintensity, central T2 hypointensity, and associated susceptibility artifact most consistent with hemorrhage in the posterior and inferior central pons. There is mild surrounding T2/FLAIR hyperintensity which likely represents edema. ORBITS: No significant abnormality. SINUSES AND MASTOIDS: No significant abnormality. BONES AND SOFT TISSUES: Normal marrow signal. No soft tissue abnormality. IMPRESSION: 1. Small hemorrhage within the posterior-inferior central pons with mild surrounding edema. This finding was discussed with Dr. Broadus Costilla by phone at 4:20 PM. Electronically signed by: Prentice Spade MD 03/08/2024 04:23 PM EST RP Workstation: [id number]     Procedures   Medications Ordered in the ED  labetalol  (NORMODYNE ) injection 10 mg (has no administration in time range)                                    Medical Decision Making Problems Addressed: Diplopia: acute illness or injury Elevated blood pressure reading:  acute illness or injury Essential hypertension: chronic illness or injury with exacerbation, progression, or side effects of treatment that poses a threat to life or  bodily functions Hemorrhagic stroke [facility]): acute illness or injury with systemic symptoms that poses a threat to life or bodily functions Pontine hemorrhage [facility]): acute illness or injury with systemic symptoms that poses a threat to life or bodily functions  Amount and/or Complexity of Data Reviewed External Data Reviewed: notes. Labs: ordered. Decision-making details documented in ED Course. Radiology: ordered and independent interpretation performed. Decision-making details documented in ED Course. ECG/medicine tests: ordered and independent interpretation performed. Decision-making details documented in ED Course. Discussion of management or test interpretation with external provider(s): Neurology, medicine  Risk Prescription drug management. Decision regarding hospitalization.   Iv ns. Continuous pulse ox and cardiac monitoring. Labs ordered/sent. Imaging ordered.   Differential diagnosis includes cva, ich, etc . Dispo decision including potential need for admission considered - will get labs and imaging and reassess.   Reviewed nursing notes and prior charts for additional history. External reports reviewed. Additional history from:  Cardiac monitor: sinus rhythm, rate 70.  Neurology consulted - discussed pt with Dr michaela - he indicates admit to hospitalists, give dose of labetalol  iv for bp, ideally to keep at/around 140-160.   Hospitalists consulted for admission.   Labs reviewed/interpreted by me - wbc, hgb and plt normal. Glucose ok.   MRI reviewed/interpreted by me - hemorrhage/pons  Neurochecks, repeat exam, no change.   Recheck bp mildly improved.   CRITICAL CARE pontine hemorrhage, hemorrhagic stroke.  Performed by: Natina Wiginton E Taydem Cavagnaro Total critical care time: 45 minutes Critical care time was  exclusive of separately billable procedures and treating other patients. Critical care was necessary to treat or prevent imminent or life-threatening deterioration. Critical care was time spent personally by me on the following activities: development of treatment plan with patient and/or surrogate as well as nursing, discussions with consultants, evaluation of patient's response to treatment, examination of patient, obtaining history from patient or surrogate, ordering and performing treatments and interventions, ordering and review of laboratory studies, ordering and review of radiographic studies, pulse oximetry and re-evaluation of patient's condition.       Final diagnoses:  None    ED Discharge Orders     None           Bernard Drivers, MD 03/08/24 1725  "

## 2024-03-09 ENCOUNTER — Inpatient Hospital Stay (HOSPITAL_COMMUNITY)

## 2024-03-09 LAB — LIPID PANEL
Cholesterol: 184 mg/dL (ref 0–200)
HDL: 57 mg/dL
LDL Cholesterol: 108 mg/dL — ABNORMAL HIGH (ref 0–99)
Total CHOL/HDL Ratio: 3.2 ratio
Triglycerides: 94 mg/dL
VLDL: 19 mg/dL (ref 0–40)

## 2024-03-09 LAB — ECHOCARDIOGRAM COMPLETE
AR max vel: 1.57 cm2
AV Area VTI: 1.64 cm2
AV Area mean vel: 1.56 cm2
AV Mean grad: 8.4 mmHg
AV Peak grad: 15.5 mmHg
Ao pk vel: 1.97 m/s
Area-P 1/2: 2.43 cm2
Height: 61 in
S' Lateral: 2.2 cm
Weight: 2000 [oz_av]

## 2024-03-09 MED ORDER — ACETAMINOPHEN 325 MG PO TABS
650.0000 mg | ORAL_TABLET | Freq: Four times a day (QID) | ORAL | Status: AC | PRN
  Administered 2024-03-09: 650 mg via ORAL
  Filled 2024-03-09: qty 2

## 2024-03-09 MED ORDER — PRAVASTATIN SODIUM 40 MG PO TABS
80.0000 mg | ORAL_TABLET | Freq: Every day | ORAL | Status: AC
  Administered 2024-03-09: 80 mg via ORAL
  Filled 2024-03-09: qty 2

## 2024-03-09 NOTE — ED Notes (Signed)
 Patient is resting comfortably.

## 2024-03-09 NOTE — Evaluation (Signed)
 Clinical/Bedside Swallow Evaluation Patient Details  Name: Erin Herring MRN: [id number] Date of Birth: 06/25/1945  Today's Date: 03/09/2024 Time: SLP Start Time (ACUTE ONLY): 1416 SLP Stop Time (ACUTE ONLY): 1435 SLP Time Calculation (min) (ACUTE ONLY): 19 min  Past Medical History:  Past Medical History:  Diagnosis Date   Hypertension    Urinary incontinence    Past Surgical History:  Past Surgical History:  Procedure Laterality Date   ABDOMINAL HYSTERECTOMY  04/2010   TAH/BSO/A & P repair--Dr. Nikki   BREAST SURGERY  08/2012   Breast lift   CERVICAL DISC SURGERY     Dr. Carles   COSMETIC SURGERY  10/2012   eye lift   DILATION AND CURETTAGE OF UTERUS     TUBAL LIGATION     tubal reanastomosis     tummy tuck  08/2012   TVT/Sacrocolpopexy  04/2010   Dr. Nikki   HPI:  Erin Herring is a 79 yo woman admitted on 03/08/24 due to horizontal diplopia with far vision that goes away with monocular vision. MRI with findings of small hemorrhage within posterior-inferior central pons with mild surrounding edema.    Assessment / Plan / Recommendation  Clinical Impression   Pt reported acute onset of dysphagia symptoms characterized by globus sensation with acute stroke symptoms. She reported symptoms have mostly resolved, though would like SLP to assess. She denied any hx of dysphagia.   Pt presents with a functional oropharyngeal swallow per clinical swallow assessment completed today. Oral prep and transit prompt with complete oral clearance. Pharyngeal swallow initiation appeared timely with laryngeal elevation noted. No overt or subtle s/s of aspiration noted across consistencies.   Speech screen: conversational speech 100% fluent and intelligible with no dysarthria or anomia noted. Pt is Ox4 and denied concerns for acute cognitive changes. A formal speech/language/cognitive assessment is not indicated at this time.   Recommend continue current diet at tolerated and PO meds as tolerated. No  acute SLP needs identified at this time. SLP will sign off. Please re-consult if pt exhibits concerns for aspiration with PO intake.   SLP Visit Diagnosis:  (r/o oropharyngeal dysphagia)    Aspiration Risk  No limitations    Diet Recommendation Regular;Thin liquid    Liquid Administration via: Cup;Straw Medication Administration: Whole meds with liquid Supervision: Patient able to self feed Compensations: Slow rate;Small sips/bites Postural Changes: Seated upright at 90 degrees    Other Recommendations Oral Care Recommendations: Oral care BID      Assistance Recommended at Discharge  None from SLP standpoint  Functional Status Assessment Patient has not had a recent decline in their functional status         Prognosis Prognosis for improved oropharyngeal function: Good      Swallow Study   General Date of Onset: 03/09/24 HPI: Erin Herring is a 79 yo woman admitted on 03/08/24 due to horizontal diplopia with far vision that goes away with monocular vision. MRI with findings of small hemorrhage within posterior-inferior central pons with mild surrounding edema. Type of Study: Bedside Swallow Evaluation Previous Swallow Assessment: none per chart Diet Prior to this Study: Regular;Thin liquids (Level 0) Temperature Spikes Noted: No Respiratory Status: Room air History of Recent Intubation: No Behavior/Cognition: Alert;Cooperative;Pleasant mood Oral Cavity Assessment: Within Functional Limits Oral Care Completed by SLP: No Oral Cavity - Dentition: Adequate natural dentition Vision: Functional for self-feeding Self-Feeding Abilities: Able to feed self Patient Positioning: Upright in bed Baseline Vocal Quality: Normal Volitional Cough: Strong Volitional Swallow: Able to  elicit    Oral/Motor/Sensory Function Overall Oral Motor/Sensory Function: Within functional limits   Ice Chips Ice chips: Not tested   Thin Liquid Thin Liquid: Within functional limits Presentation: Cup;Self  Fed    Nectar Thick Nectar Thick Liquid: Not tested   Honey Thick Honey Thick Liquid: Not tested   Puree Puree: Within functional limits Presentation: Self Fed   Solid     Solid: Within functional limits Presentation: Self Fed      Erin Herring 03/09/2024,4:44 PM

## 2024-03-09 NOTE — Evaluation (Signed)
 Occupational Therapy Evaluation Patient Details Name: Erin Herring MRN: [id number] DOB: 07/09/1945 Today's Date: 03/09/2024   History of Present Illness   Pt is 79 yo presenting [facility] on 2/[facility] due to horizontal diplopia with far vision that goes away with monocular vision. MRI with findings of small hemorrhage within posterior-inferior central pons with mild surrounding edema.     Clinical Impressions PTA pt lives independently with her husband, who is currently beginning rehab at a SNF near her home. Pt's primary concern is her double vision. She has residual numbness in scalp and tips of fingers L hand without impact on her function. Pt wears bifocals and has horizontal diplopia with distant vision. Nasal portion above bifocal taped on L lens to reduce double image and allow for reading. Pt states occlusion makes everything so much better. Educated pt on progression of occlusion as well as eye strengthening exercises. Handouts provided and reviewed. Recommend pt follow up with OT at a [facility]. Pt plans to follow up with her eye doctor as well for possible use of temporary prisms. Will attempt to follow up in the am to answer any questions.  Pt safe to ambulate in her room - nsg made aware. Educated pt on signs/symptoms of stroke using BeFast. Pt verbalized understanding.      If plan is discharge home, recommend the following:   Assist for transportation     Functional Status Assessment   Patient has had a recent decline in their functional status and demonstrates the ability to make significant improvements in function in a reasonable and predictable amount of time.     Equipment Recommendations   None recommended by OT     Recommendations for Other Services         Precautions/Restrictions   Precautions Precautions: Fall Recall of Precautions/Restrictions: Intact Restrictions Weight Bearing Restrictions Per Provider Order: No     Mobility Bed  Mobility Overal bed mobility: Independent                  Transfers Overall transfer level: Modified independent Equipment used: None               General transfer comment: slight increased in BOS      Balance Overall balance assessment: Modified Independent                                         ADL either performed or assessed with clinical judgement   ADL Overall ADL's : At baseline                                       General ADL Comments: discussed home safety given vision changes     Vision Baseline Vision/History: 1 Wears glasses Ability to See in Adequate Light: 1 Impaired Patient Visual Report: Diplopia Vision Assessment?: Wears glasses for driving;Wears glasses for reading;Yes Eye Alignment: Within Functional Limits Ocular Range of Motion: Restricted on the left (looking to side) Alignment/Gaze Preference: Within Defined Limits Tracking/Visual Pursuits: Decreased smoothness of horizontal tracking Saccades: Decreased speed of saccadic movement Convergence: Within functional limits Visual Fields: No apparent deficits Diplopia Assessment: Objects split side to side;Present in far gaze   Appears to be R eye dominant  Perception         Praxis  Pertinent Vitals/Pain Pain Assessment Pain Assessment: No/denies pain     Extremity/Trunk Assessment Upper Extremity Assessment Upper Extremity Assessment: Right hand dominant;LUE deficits/detail LUE Deficits / Details: mild tingling in tips of fingers LUE Sensation: decreased light touch (tips of fingers; no functional impact)   Lower Extremity Assessment Lower Extremity Assessment: Defer to PT evaluation   Cervical / Trunk Assessment Cervical / Trunk Assessment: Normal   Communication Communication Communication: No apparent difficulties   Cognition Arousal: Alert Behavior During Therapy: WFL for tasks assessed/performed Cognition: No apparent  impairments                               Following commands: Intact       Cueing  General Comments   Cueing Techniques: Verbal cues  no signs/symptoms of cadiac/respiratory distress   Exercises Exercises: Other exercises Other Exercises Other Exercises: eye stretches; tracking; gaze stabilization, saccades, spatial localization and fusion   Shoulder Instructions      Home Living Family/patient expects to be discharged to:: Private residence Living Arrangements: Spouse/significant other;Children (son) Available Help at Discharge: Family;Available PRN/intermittently Type of Home: House Home Access: Stairs to enter Entrance Stairs-Number of Steps: 2-3 steps Entrance Stairs-Rails: Left Home Layout: Two level;Able to live on main level with bedroom/bathroom Alternate Level Stairs-Number of Steps: 13 Alternate Level Stairs-Rails: Right Bathroom Shower/Tub: Tub/shower unit   Bathroom Toilet: Handicapped height Bathroom Accessibility: Yes   Home Equipment: Pharmacist, Hospital (2 wheels);BSC/3in1;Grab bars - tub/shower          Prior Functioning/Environment Prior Level of Function : Independent/Modified Independent;Driving             Mobility Comments: Ind with mobility ADLs Comments: Ind with ADLs and IADL's    OT Problem List: Impaired vision/perception   OT Treatment/Interventions: Self-care/ADL training;Therapeutic exercise;Neuromuscular education;Therapeutic activities;Visual/perceptual remediation/compensation;Patient/family education      OT Goals(Current goals can be found in the care plan section)   Acute Rehab OT Goals Patient Stated Goal: improve vision OT Goal Formulation: With patient Time For Goal Achievement: 03/23/24 Potential to Achieve Goals: Good   OT Frequency:  Min 2X/week    Co-evaluation              AM-PAC OT 6 Clicks Daily Activity     Outcome Measure Help from another person eating meals?:  None Help from another person taking care of personal grooming?: None Help from another person toileting, which includes using toliet, bedpan, or urinal?: None Help from another person bathing (including washing, rinsing, drying)?: None Help from another person to put on and taking off regular upper body clothing?: None Help from another person to put on and taking off regular lower body clothing?: None 6 Click Score: 24   End of Session Nurse Communication: Other (comment) (pt independent with mobility)  Activity Tolerance: Patient tolerated treatment well Patient left: in bed;with call bell/phone within reach  OT Visit Diagnosis: Low vision, both eyes (H54.2)                Time: 1710-1740 OT Time Calculation (min): 30 min Charges:  OT General Charges $OT Visit: 1 Visit OT Evaluation $OT Eval Low Complexity: 1 Low OT Treatments $Therapeutic Activity: 8-22 mins  Kreg Sink, OT/L   Acute OT Clinical Specialist Acute Rehabilitation Services Pager [phone] Office [phone]   [facility] 03/09/2024, 6:43 PM

## 2024-03-09 NOTE — Progress Notes (Addendum)
 STROKE TEAM PROGRESS NOTE   INTERIM HISTORY/SUBJECTIVE Initially noted shaking and then the numbness and double vision. She was sent from her optometrist office.  She reports she still is having horizontal diplopia with far vision.  It does go away with monocular vision.  She states she has been under a lot of stress lately.  Her husband was taken to the hospital couple of days ago and had a pacemaker placed.  He is currently being transition to rehab through [facility].  Ms. Fritcher tells me that she did mention her symptoms to EMS when they were there to pick him up and they thought she was having a panic attack.  Her son did give her a baby aspirin.   OBJECTIVE  CBC    Component Value Date/Time   WBC 4.6 03/08/2024 1248   RBC 4.32 03/08/2024 1248   HGB 13.1 03/08/2024 1248   HCT 39.9 03/08/2024 1248   PLT 229 03/08/2024 1248   MCV 92.4 03/08/2024 1248   MCH 30.3 03/08/2024 1248   MCHC 32.8 03/08/2024 1248   RDW 12.0 03/08/2024 1248   LYMPHSABS 1.3 03/08/2024 1248   MONOABS 0.2 03/08/2024 1248   EOSABS 0.0 03/08/2024 1248   BASOSABS 0.0 03/08/2024 1248    BMET    Component Value Date/Time   NA 141 03/08/2024 1248   K 4.3 03/08/2024 1248   CL 105 03/08/2024 1248   CO2 24 03/08/2024 1248   GLUCOSE 101 (H) 03/08/2024 1248   BUN 19 03/08/2024 1248   CREATININE 0.76 03/08/2024 1248   CALCIUM 10.1 03/08/2024 1248   GFRNONAA >60 03/08/2024 1248    IMAGING past 24 hours CT ANGIO HEAD NECK W WO CM Result Date: 03/08/2024 EXAM: CTA HEAD AND NECK WITHOUT AND WITH 03/08/2024 06:54:00 PM TECHNIQUE: CTA of the head and neck was performed without and with the administration of 75 mL of iohexol  (OMNIPAQUE ) 350 MG/ML injection. Multiplanar 2D and/or 3D reformatted images are provided for review. Automated exposure control, iterative reconstruction, and/or weight based adjustment of the mA/kV was utilized to reduce the radiation dose to as low as reasonably achievable. Stenosis of the internal carotid  arteries measured using NASCET criteria. COMPARISON: MRI head dated 03/08/2024 CLINICAL HISTORY: Neuro deficit, acute, stroke suspected. Acute neurologic deficit; stroke suspected. FINDINGS: CTA NECK: AORTIC ARCH AND ARCH VESSELS: Aortic arch is incompletely visualized. There is atherosclerosis at the origin of the left common carotid artery without significant stenosis. No dissection or arterial injury. No significant stenosis of the brachiocephalic or subclavian arteries. CERVICAL CAROTID ARTERIES: Mild atherosclerosis at the right carotid bifurcation without hemodynamically significant stenosis. Moderate atherosclerosis at the left carotid bifurcation without hemodynamically significant stenosis. No dissection or arterial injury. CERVICAL VERTEBRAL ARTERIES: Atherosclerosis adjacent to the left vertebral artery origin without significant stenosis. No dissection or arterial injury. LUNGS AND MEDIASTINUM: Unremarkable. SOFT TISSUES: No acute abnormality. BONES: Degenerative changes in the visualized spine. There is partial fusion of the C5 and C6 vertebral bodies, endplate irregularity, and moderate disc space narrowing at C6-C7. No acute abnormality. CTA HEAD: ANTERIOR CIRCULATION: Mild atherosclerosis of the carotid siphons without stenosis. No significant stenosis of the anterior cerebral arteries. No significant stenosis of the middle cerebral arteries. No aneurysm. POSTERIOR CIRCULATION: Fetal version of the left PCA. No significant stenosis of the posterior cerebral arteries. No significant stenosis of the basilar artery. No significant stenosis of the vertebral arteries. No aneurysm. OTHER: There is a hyperattenuating focus within the central aspect of the pons which measures 1.0 x  0.6 x 0.9 cm concerning for acute hemorrhage with mild associated edema, no significant mass effect. There is no midline shift. No dural venous sinus thrombosis on this non-dedicated study. IMPRESSION: 1. Acute hemorrhage in the  central pons with mild associated edema. Finding is similar to same day MRI. 2. No acute large vessel occlusion. 3. No abnormal vasculature within the region of hemorrhage. 4. Mild atherosclerosis as above. Electronically signed by: Donnice Mania MD 03/08/2024 07:25 PM EST RP Workstation: [id number]   MR BRAIN WO CONTRAST Result Date: 03/08/2024 EXAM: MRI BRAIN WITHOUT CONTRAST 03/08/2024 04:04:34 PM TECHNIQUE: Multiplanar multisequence MRI of the head/brain was performed without the administration of intravenous contrast. COMPARISON: None available. CLINICAL HISTORY: Neuro deficit, acute, stroke suspected; Left-sided weakness, double vision. Acute neurologic deficit; stroke suspected. Left-sided weakness, double vision. FINDINGS: BRAIN AND VENTRICLES: No acute infarct. No mass effect. No midline shift. No hydrocephalus. The sella is unremarkable. Normal flow voids. There is mild for age subcortical and periventricular T2 and FLAIR signal hyperintensity likely reflecting sequelae of chronic microvascular ischemia. There is a 0.8 x 1.4 cm region of T1 hyperintensity, central T2 hypointensity, and associated susceptibility artifact most consistent with hemorrhage in the posterior and inferior central pons. There is mild surrounding T2/FLAIR hyperintensity which likely represents edema. ORBITS: No significant abnormality. SINUSES AND MASTOIDS: No significant abnormality. BONES AND SOFT TISSUES: Normal marrow signal. No soft tissue abnormality. IMPRESSION: 1. Small hemorrhage within the posterior-inferior central pons with mild surrounding edema. This finding was discussed with Dr. Steinl by phone at 4:20 PM. Electronically signed by: Prentice Spade MD 03/08/2024 04:23 PM EST RP Workstation: [id number]    Vitals:   03/09/24 0330 03/09/24 0430 03/09/24 0800 03/09/24 0827  BP: (!) 98/50 (!) 103/48 (!) 113/56   Pulse: 69 69 79   Resp: 12 16 14    Temp:  (!) 97.5 F (36.4 C)  (!) 97.5 F (36.4 C)  TempSrc:  Oral   Oral  SpO2: 99% 100% 100%   Weight:      Height:         PHYSICAL EXAM General:  Alert, well-nourished, well-developed patient in no acute distress Psych:  Mood and affect appropriate for situation CV: Regular rate and rhythm on monitor Respiratory:  Regular, unlabored respirations on room air GI: Abdomen soft and nontender   NEURO:  Mental Status: AA&Ox3, patient is able to give clear and coherent history Speech/Language: speech is without dysarthria or aphasia.  Naming, repetition, fluency, and comprehension intact.  Cranial Nerves:  II: PERRL. Visual fields full.  III, IV, VI: EOMI. Eyelids elevate symmetrically.  Intermittent diplopia, but no visual field deficit V: Sensation is intact to light touch and symmetrical to face.  VII: Face is symmetrical resting and smiling VIII: hearing intact to voice. IX, X: Palate elevates symmetrically. Phonation is normal.  KP:Dynloizm shrug 5/5. XII: tongue is midline without fasciculations. Motor: 5/5 strength to all muscle groups tested.  Tone: is normal and bulk is normal Sensation- Intact to light touch bilaterally. Extinction absent to light touch to DSS.   Coordination: FTN intact bilaterally, HKS: no ataxia in BLE.No drift.  Gait- deferred  Most Recent NIH 0     ASSESSMENT/PLAN  Erin Herring is a 79 y.o. female with history of HTN admitted from optometrist office d/t double vision.  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 CTA head & neck Acute hemorrhage in the central pons with mild associated edema. No acute large vessel occlusion. No abnormal vasculature within the region of hemorrhage. Repeat CT Head Oval pontine hemorrhage (estimated volume 1 mL), not significantly changed. No significant edema or regional mass  effect. 2D Echo EF 65-70% LDL 108 HgbA1c 5.5 VTE prophylaxis - SCD No antithrombotic prior to admission, now on No antithrombotic  Therapy recommendations:  Pending Disposition:  Pending   Hypertension Home meds: Lisinopril  40 High at 180s on presentation Stable now On lisinopril  BP goal normotensive  Hyperlipidemia Home meds: Pravastatin  40 LDL 108, goal < 70 Increase pravastatin  to 80 Continue statin at discharge  Anxiety Patient reports she has been under a significant amount of stress due to her husband's health Atarax /Ativan  ordered as needed  Other stroke risk factors Advanced age  Other acute issues Diplopia - OT to consider eye occluder   Hospital day # 1   Patient seen and examined by NP/APP with MD. MD to update note as needed.   Jorene Last, DNP, FNP-BC [facility] Pager: [phone]  ATTENDING NOTE: I reviewed above note and agree with the assessment and plan. Pt was seen and examined.   PT is at the bedside. Pt lying in bed, stated that she still has some diplopia but tingling on the left has nearly resolved over the days. On exam, she is awake, alert, eyes open, orientated to age, place, time and people. No aphasia, fluent language, following all simple commands. Able to name and repeat. No gaze palsy or disconjugate, tracking bilaterally, visual field full, PERRL. No facial droop. Tongue midline. Bilateral UEs 5/5, no drift. Bilaterally LEs 5/5, no drift. Sensation symmetrical bilaterally except some tingling sensation on the left fingertips, R FTN intact but slight dysmetria on the L, gait not tested.   Pt small ICH likely due to hypertension, BP on presentation was high but now improved. Recommend home BP monitoring. LDL 108, increase home pravastatin  from 40 to 80. OT to consider eye occluder. PT and OT pending. Will follow.  For detailed assessment and plan, please refer to above as I have made changes wherever appropriate.    Ary Cummins, MD PhD Stroke Neurology 03/09/2024 4:02 PM    To contact Stroke Continuity provider, please refer to [email or web address]. After hours, contact General Neurology

## 2024-03-09 NOTE — Progress Notes (Signed)
 Came back from break and patient is in the room together with the charge nurse. No report received from the ED. Pt. has some double vision and some numbness on the parietal area of her head. Pt. denies dizziness, headache, pain, tingling sensation. Pt. Reported some numbness on the tips of her fingers.

## 2024-03-09 NOTE — Progress Notes (Addendum)
 " PROGRESS NOTE    Erin Herring  FMW:[id number] DOB: 1945-07-09 DOA: 03/08/2024 PCP: Dayna Motto, DO   Brief Narrative: 79 year old with past medical history significant for hypertension, urinary incontinence, presents with double vision, left-sided numbness.  Patient reports double vision for 4 days.  She has been under stress her husband was admitted to the hospital and required pacemaker placement, her blood pressure increased to 210/105 and she had some left side numbness. Evaluation in the ED MRI of the brain noted small hemorrhage within the posterior inferior central pons with mild surrounding edema.  Neurology consulted and recommended completion of the stroke workup.    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 - MRI brain: Showed pontine hemorrhage with surrounding edema - Neurology consulted - CT angio head and neck Plan to repeat CT head today Stable.  ECHO; normal EF  Hypertensive emergency: - Lisinopril  was resumed. BP improved.   Heart murmur - Follow echo; mild aortic valve stenosis.   Anxiety: - Atarax /Ativan  as needed     Estimated body mass index is 23.62 kg/m as calculated from the following:   Height as of this encounter: 5' 1 (1.549 m).   Weight as of this encounter: 56.7 kg.   DVT prophylaxis: SCDs Code Status: Full code Family Communication: Discussed with patient Disposition Plan:  Status is: Inpatient Remains inpatient appropriate because: Management of hemorrhagic pontine infarct    Consultants:  Neurology  Procedures:  ECHO  Antimicrobials:    Subjective: She is alert and conversant.  She still having double vision, report mild  headache.  Objective: Vitals:   03/09/24 0310 03/09/24 0330 03/09/24 0430 03/09/24 0800  BP: (!) 103/57 (!) 98/50 (!) 103/48 (!) 113/56  Pulse:  69 69 79  Resp:  12 16 14   Temp:   (!) 97.5 F (36.4 C)   TempSrc:   Oral   SpO2:  99% 100% 100%  Weight:      Height:       No intake or output data in the 24 hours ending 03/09/24 0815 Filed Weights   03/08/24 1231  Weight: 56.7 kg    Examination:  General exam: Appears calm and comfortable  Respiratory system: Clear to auscultation. Respiratory effort normal. Cardiovascular system: S1 & S2 heard, RRR. No JVD, murmurs, rubs, gallops or clicks. No pedal edema. Gastrointestinal system: Abdomen is nondistended, soft and nontender. No organomegaly or masses felt. Normal bowel sounds heard. Central nervous system: Alert and oriented. Motor strength 5/5  Extremities: Symmetric 5 x 5 power.   Data Reviewed: I have personally reviewed following labs and imaging studies  CBC: Recent Labs  Lab 03/08/24 1248  WBC 4.6  NEUTROABS 3.0  HGB 13.1  HCT 39.9  MCV 92.4  PLT 229   Basic Metabolic Panel: Recent Labs  Lab 03/08/24 1248  NA 141  K 4.3  CL 105  CO2 24  GLUCOSE 101*  BUN 19  CREATININE 0.76  CALCIUM 10.1   GFR: Estimated Creatinine Clearance: 45.1 mL/min (by C-G formula based on SCr of 0.76 mg/dL). Liver Function Tests: Recent Labs  Lab 03/08/24 1248  AST 31  ALT 38  ALKPHOS 56  BILITOT 0.3  PROT 7.3  ALBUMIN 4.7  No results for input(s): LIPASE, AMYLASE in the last 168 hours. No results for input(s): AMMONIA in the last 168 hours. Coagulation Profile: Recent Labs  Lab 03/08/24 1248  INR 0.9   Cardiac Enzymes: No results for input(s): CKTOTAL, CKMB, CKMBINDEX, TROPONINI in the last 168 hours. BNP (last 3 results) No results for input(s): PROBNP in the last 8760 hours. HbA1C: Recent Labs    03/08/24 1248  HGBA1C 5.5   CBG: Recent Labs  Lab 03/08/24 1252  GLUCAP 97   Lipid  Profile: Recent Labs    03/09/24 0232  CHOL 184  HDL 57  LDLCALC 108*  TRIG 94  CHOLHDL 3.2   Thyroid  Function Tests: No results for input(s): TSH, T4TOTAL, FREET4, T3FREE, THYROIDAB in the last 72 hours. Anemia Panel: No results for input(s): VITAMINB12, FOLATE, FERRITIN, TIBC, IRON, RETICCTPCT in the last 72 hours. Sepsis Labs: No results for input(s): PROCALCITON, LATICACIDVEN in the last 168 hours.  No results found for this or any previous visit (from the past 240 hours).       Radiology Studies: CT ANGIO HEAD NECK W WO CM Result Date: 03/08/2024 EXAM: CTA HEAD AND NECK WITHOUT AND WITH 03/08/2024 06:54:00 PM TECHNIQUE: CTA of the head and neck was performed without and with the administration of 75 mL of iohexol  (OMNIPAQUE ) 350 MG/ML injection. Multiplanar 2D and/or 3D reformatted images are provided for review. Automated exposure control, iterative reconstruction, and/or weight based adjustment of the mA/kV was utilized to reduce the radiation dose to as low as reasonably achievable. Stenosis of the internal carotid arteries measured using NASCET criteria. COMPARISON: MRI head dated 03/08/2024 CLINICAL HISTORY: Neuro deficit, acute, stroke suspected. Acute neurologic deficit; stroke suspected. FINDINGS: CTA NECK: AORTIC ARCH AND ARCH VESSELS: Aortic arch is incompletely visualized. There is atherosclerosis at the origin of the left common carotid artery without significant stenosis. No dissection or arterial injury. No significant stenosis of the brachiocephalic or subclavian arteries. CERVICAL CAROTID ARTERIES: Mild atherosclerosis at the right carotid bifurcation without hemodynamically significant stenosis. Moderate atherosclerosis at the left carotid bifurcation without hemodynamically significant stenosis. No dissection or arterial injury. CERVICAL VERTEBRAL ARTERIES: Atherosclerosis adjacent to the left vertebral artery origin without significant  stenosis. No dissection or arterial injury. LUNGS AND MEDIASTINUM: Unremarkable. SOFT TISSUES: No acute abnormality. BONES: Degenerative changes in the visualized spine. There is partial fusion of the C5 and C6 vertebral bodies, endplate irregularity, and moderate disc space narrowing at C6-C7. No acute abnormality. CTA HEAD: ANTERIOR CIRCULATION: Mild atherosclerosis of the carotid siphons without stenosis. No significant stenosis of the anterior cerebral arteries. No significant stenosis of the middle cerebral arteries. No aneurysm. POSTERIOR CIRCULATION: Fetal version of the left PCA. No significant stenosis of the posterior cerebral arteries. No significant stenosis of the basilar artery. No significant stenosis of the vertebral arteries. No aneurysm. OTHER: There is a hyperattenuating focus within the central aspect of the pons which measures 1.0 x 0.6 x 0.9 cm concerning for acute hemorrhage with mild associated edema, no significant mass effect. There is no midline shift. No dural venous sinus thrombosis on this non-dedicated study. IMPRESSION: 1. Acute hemorrhage in the central pons with mild associated edema. Finding is similar to same day MRI. 2. No acute large vessel occlusion. 3. No abnormal vasculature within the region of hemorrhage. 4. Mild atherosclerosis as above. Electronically signed by: Donnice Mania MD 03/08/2024 07:25 PM EST RP Workstation: [id number]   MR BRAIN WO CONTRAST Result Date: 03/08/2024 EXAM: MRI BRAIN WITHOUT CONTRAST 03/08/2024 04:04:34 PM  TECHNIQUE: Multiplanar multisequence MRI of the head/brain was performed without the administration of intravenous contrast. COMPARISON: None available. CLINICAL HISTORY: Neuro deficit, acute, stroke suspected; Left-sided weakness, double vision. Acute neurologic deficit; stroke suspected. Left-sided weakness, double vision. FINDINGS: BRAIN AND VENTRICLES: No acute infarct. No mass effect. No midline shift. No hydrocephalus. The sella is  unremarkable. Normal flow voids. There is mild for age subcortical and periventricular T2 and FLAIR signal hyperintensity likely reflecting sequelae of chronic microvascular ischemia. There is a 0.8 x 1.4 cm region of T1 hyperintensity, central T2 hypointensity, and associated susceptibility artifact most consistent with hemorrhage in the posterior and inferior central pons. There is mild surrounding T2/FLAIR hyperintensity which likely represents edema. ORBITS: No significant abnormality. SINUSES AND MASTOIDS: No significant abnormality. BONES AND SOFT TISSUES: Normal marrow signal. No soft tissue abnormality. IMPRESSION: 1. Small hemorrhage within the posterior-inferior central pons with mild surrounding edema. This finding was discussed with Dr. Steinl by phone at 4:20 PM. Electronically signed by: Prentice Spade MD 03/08/2024 04:23 PM EST RP Workstation: [id number]        Scheduled Meds:   stroke: early stages of recovery book   Does not apply Once   hydrALAZINE   10 mg Intravenous Once   lisinopril   2.5 mg Oral Daily   Continuous Infusions:  sodium chloride  40 mL/hr at 03/08/24 2030     LOS: 1 day    Time spent: 35 Minutes    Tavaras Goody A Stephanye Finnicum, MD [facility]   If 7PM-7AM, please contact night-coverage [email or web address]  03/09/2024, 8:15 AM   "

## 2024-03-09 NOTE — Evaluation (Signed)
 Physical Therapy Evaluation Patient Details Name: Erin Herring MRN: [id number] DOB: [birth year] Today's Date: 03/09/2024  History of Present Illness  Pt is 79 yo presenting to [facility] on 2/5 due to horizontal diplopia with far vision that goes away with monocular vision. MRI with findings of small hemorrhage within posterior-inferior central pons with mild surrounding edema.  Clinical Impression  Pt is currently ind for bed mobility, Mod I for sit to stand, 150 ft of gait and 10 stairs per home set up. Pt has assistance from son at home most of the time except weekend nights when he works. Pt currently biggest issue is double vision and will benefit from OT consult to address for improved balance. Pt with very mild compensatory strategies for impaired balance due to vision including slight decrease in speed in gait and slightly increased BOS. Pt will benefit from continued mobility while in the hospital by nursing staff but due to pt is at baseline level of functioning does not have needs for acute physical therapy services at this time.  Pt will be discharged from skilled physical therapy services at this time. Please re-consult if further needs arise.       If plan is discharge home, recommend the following: Assist for transportation;Assistance with cooking/housework     Equipment Recommendations None recommended by PT     Functional Status Assessment Patient has had a recent decline in their functional status and demonstrates the ability to make significant improvements in function in a reasonable and predictable amount of time.     Precautions / Restrictions Precautions Precautions: Fall Recall of Precautions/Restrictions: Intact Restrictions Weight Bearing Restrictions Per Provider Order: No      Mobility  Bed Mobility Overal bed mobility: Independent    Transfers Overall transfer level: Modified independent Equipment used: None               General transfer comment: slight  increased in BOS    Ambulation/Gait Ambulation/Gait assistance: Modified independent (Device/Increase time) Gait Distance (Feet): 200 Feet Assistive device: None Gait Pattern/deviations: Step-through pattern, Wide base of support Gait velocity: mildly decreased Gait velocity interpretation: 1.31 - 2.62 ft/sec, indicative of limited community ambulator   General Gait Details: slightly increased, BOS no LOB during gait.  Stairs Stairs: Yes Stairs assistance: Modified independent (Device/Increase time) Stair Management: One rail Right, One rail Left, Alternating pattern, Step to pattern Number of Stairs: 10 General stair comments: step to gait pattern descending, rail on the L no LOB, Pt takes her time.  Modified Rankin (Stroke Patients Only) Modified Rankin (Stroke Patients Only) Pre-Morbid Rankin Score: No symptoms Modified Rankin: No significant disability     Balance Overall balance assessment: Modified Independent       Pertinent Vitals/Pain Pain Assessment Pain Assessment: No/denies pain    Home Living Family/patient expects to be discharged to:: Private residence Living Arrangements: Spouse/significant other;Children (son) Available Help at Discharge: Family;Available PRN/intermittently Type of Home: House Home Access: Stairs to enter Entrance Stairs-Rails: Left Entrance Stairs-Number of Steps: 2-3 steps Alternate Level Stairs-Number of Steps: 13 Home Layout: Two level;Able to live on main level with bedroom/bathroom Home Equipment: Shower seat;Rolling Walker (2 wheels);BSC/3in1;Grab bars - tub/shower      Prior Function Prior Level of Function : Independent/Modified Independent;Driving             Mobility Comments: Ind with mobility ADLs Comments: Ind with ADLs and IADL's     Extremity/Trunk Assessment   Upper Extremity Assessment Upper Extremity Assessment: Right hand dominant;Overall [facility]  for tasks assessed;Defer to OT evaluation    Lower  Extremity Assessment Lower Extremity Assessment: Overall WFL for tasks assessed    Cervical / Trunk Assessment Cervical / Trunk Assessment: Normal  Communication   Communication Communication: No apparent difficulties    Cognition Arousal: Alert Behavior During Therapy: WFL for tasks assessed/performed   PT - Cognitive impairments: No apparent impairments     Following commands: Intact       Cueing Cueing Techniques: Verbal cues     General Comments General comments (skin integrity, edema, etc.): no signs/symptoms of cadiac/respiratory distress        Assessment/Plan    PT Assessment Patient does not need any further PT services         PT Goals (Current goals can be found in the Care Plan section)  Acute Rehab PT Goals PT Goal Formulation: All assessment and education complete, DC therapy     AM-PAC PT 6 Clicks Mobility  Outcome Measure Help needed turning from your back to your side while in a flat bed without using bedrails?: None Help needed moving from lying on your back to sitting on the side of a flat bed without using bedrails?: None Help needed moving to and from a bed to a chair (including a wheelchair)?: None Help needed standing up from a chair using your arms (e.g., wheelchair or bedside chair)?: None Help needed to walk in hospital room?: None Help needed climbing 3-5 steps with a railing? : None 6 Click Score: 24    End of Session Equipment Utilized During Treatment: Gait belt Activity Tolerance: Patient tolerated treatment well Patient left: in bed;with call bell/phone within reach;Other (comment) (speech therapy in room)        Time: [id number] PT Time Calculation (min) (ACUTE ONLY): 21 min   Charges:   PT Evaluation $PT Eval Low Complexity: 1 Low   PT General Charges $$ ACUTE PT VISIT: 1 Visit    Dorothyann Maier, DPT, CLT  Acute Rehabilitation Services Office: [phone] (Secure chat preferred)   Dorothyann VEAR Maier 03/09/2024, 4:18 PM
